# Patient Record
Sex: Female | Born: 1995 | Race: Black or African American | Hispanic: No | Marital: Single | Smoking: Never smoker
Health system: Southern US, Community
[De-identification: ages and names within clinical notes are randomized; demographics above are authoritative.]

---
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
## 2015-03-18 ENCOUNTER — Telehealth: Payer: Self-pay | Admitting: Family

## 2015-03-18 ENCOUNTER — Ambulatory Visit (INDEPENDENT_AMBULATORY_CARE_PROVIDER_SITE_OTHER): Payer: BLUE CROSS/BLUE SHIELD | Admitting: Family

## 2015-03-18 ENCOUNTER — Encounter: Payer: Self-pay | Admitting: Family

## 2015-03-18 ENCOUNTER — Other Ambulatory Visit: Payer: Self-pay | Admitting: Family

## 2015-03-18 ENCOUNTER — Ambulatory Visit (INDEPENDENT_AMBULATORY_CARE_PROVIDER_SITE_OTHER)
Admission: RE | Admit: 2015-03-18 | Discharge: 2015-03-18 | Disposition: A | Discharge status: Home / Self Care | Payer: BLUE CROSS/BLUE SHIELD | Source: Ambulatory Visit | Attending: Family | Admitting: Family

## 2015-03-18 VITALS — BP 102/60 | HR 98 | Temp 98.2°F | Resp 16 | Ht 63.0 in | Wt 117.0 lb

## 2015-03-18 DIAGNOSIS — M25559 Pain in unspecified hip: Secondary | ICD-10-CM | POA: Insufficient documentation

## 2015-03-18 DIAGNOSIS — N946 Dysmenorrhea, unspecified: Secondary | ICD-10-CM | POA: Diagnosis not present

## 2015-03-18 NOTE — Progress Notes (Signed)
Pre visit review using our clinic review tool, if applicable. No additional management support is needed unless otherwise documented below in the visit note. 

## 2015-03-18 NOTE — Assessment & Plan Note (Signed)
Symptoms and exam consistent with dysmenorrhea. Recommend starting over-the-counter medications as needed for symptom relief and supportive care. Refer to gynecology for further management. Follow-up if symptoms worsen or fail to improve prior to referral.

## 2015-03-18 NOTE — Progress Notes (Addendum)
Subjective:    Patient ID: Mikayla Robinson, female    DOB: 06/29/1995, 21 y.o.   MRN: [id number]  Chief Complaint  Patient presents with  . Establish Care    when sleeping she wakes up with pains in her upper thigh area, does not occur every night but when it does happen its really bad pain, would like a referral to GYN     HPI:  Mikayla Robinson is a 20 y.o. female who  has no past medical history on file. and presents today for an office visit to establish care.    1.) Dysmenorrhia - Associated symptom of dysmennorihia. She started at age 2. Period length is about 5 days and regular. Denies any modifying factors that make it better. Not currently on birth control.   2.) Groin pain - Associated symptom of pain located in her bilateral groins has been going on for about 1 year and described as an aching that comes and goes. Denies trauma or specific events that would have caused. Denies sounds/sensations heard or felt. Denies any modifying factors or treatments in attempted treatments. Aggravating factors include movement on occasion. Timing of the symptoms is worse at night and when she wakes up in the morning.   No Known Allergies   No current outpatient prescriptions on file prior to visit.   No current facility-administered medications on file prior to visit.    History reviewed. No pertinent past surgical history.   Review of Systems  Constitutional: Negative for fever and chills.  Genitourinary: Positive for menstrual problem. Negative for dysuria, urgency, frequency and hematuria.  Musculoskeletal:       Positive for bilateral hip pain.      Objective:    BP 102/60 mmHg  Pulse 98  Temp(Src) 98.2 F (36.8 C) (Oral)  Resp 16  Ht  (1.6 m)  Wt 117 lb (53.071 kg)  BMI 20.73 kg/m2  SpO2 98%  LMP 02/19/2015 Nursing note and vital signs reviewed.  Physical Exam  Constitutional: She is oriented to person, place, and time. She appears well-developed and  well-nourished. No distress.  Cardiovascular: Normal rate, regular rhythm, normal heart sounds and intact distal pulses.   Pulmonary/Chest: Effort normal and breath sounds normal.  Musculoskeletal:  Bilateral hips - no obvious deformity, discoloration, or edema noted. Mild tenderness elicited over anterior aspect a hip. Range of motion is within normal limits. Manual muscle testing reveals discomfort with hip flexion and internal rotation. Hip scouring is negative. Distal pulses and sensation are intact and appropriate.  Neurological: She is alert and oriented to person, place, and time.  Skin: Skin is warm and dry.  Psychiatric: She has a normal mood and affect. Her behavior is normal. Judgment and thought content normal.       Assessment & Plan:   Problem List Items Addressed This Visit      Genitourinary   Dysmenorrhea    Symptoms and exam consistent with dysmenorrhea. Recommend starting over-the-counter medications as needed for symptom relief and supportive care. Refer to gynecology for further management. Follow-up if symptoms worsen or fail to improve prior to referral.        Other   Hip pain - Primary    Hip pain most likely related to muscle skeletal origin with concern for underlying bony pathology. Obtain bilateral pelvis and hip x-rays to rule out avascular necrosis. Treat conservatively with over-the-counter medications as needed for symptom relief and supportive care. Start home exercise therapy. Follow-up if symptoms worsen or  fail to improve prior to x-ray results.      Relevant Orders   DG HIPS BILAT WITH PELVIS 2V (Completed)

## 2015-03-18 NOTE — Patient Instructions (Signed)
Thank you for choosing [facility].  Summary/Instructions:  Ibuprofen as needed for discomfort.  Stretching daily. Stop downstairs for x-rays.  Referrals have been made during this visit. You should expect to hear back from our schedulers in about 7-10 days in regards to establishing an appointment with the specialists we discussed.   If your symptoms worsen or fail to improve, please contact our office for further instruction, or in case of emergency go directly to the emergency room at the closest medical facility.   Dysmenorrhea Menstrual cramps (dysmenorrhea) are caused by the muscles of the uterus tightening (contracting) during a menstrual period. For some women, this discomfort is merely bothersome. For others, dysmenorrhea can be severe enough to interfere with everyday activities for a few days each month. Primary dysmenorrhea is menstrual cramps that last a couple of days when you start having menstrual periods or soon after. This often begins after a teenager starts having her period. As a woman gets older or has a baby, the cramps will usually lessen or disappear. Secondary dysmenorrhea begins later in life, lasts longer, and the pain may be stronger than primary dysmenorrhea. The pain may start before the period and last a few days after the period.  CAUSES  Dysmenorrhea is usually caused by an underlying problem, such as:  The tissue lining the uterus grows outside of the uterus in other areas of the body (endometriosis).  The endometrial tissue, which normally lines the uterus, is found in or grows into the muscular walls of the uterus (adenomyosis).  The pelvic blood vessels are engorged with blood just before the menstrual period (pelvic congestive syndrome).  Overgrowth of cells (polyps) in the lining of the uterus or cervix.  Falling down of the uterus (prolapse) because of loose or stretched ligaments.  Depression.  Bladder problems, infection, or  inflammation.  Problems with the intestine, a tumor, or irritable bowel syndrome.  Cancer of the female organs or bladder.  A severely tipped uterus.  A very tight opening or closed cervix.  Noncancerous tumors of the uterus (fibroids).  Pelvic inflammatory disease (PID).  Pelvic scarring (adhesions) from a previous surgery.  Ovarian cyst.  An intrauterine device (IUD) used for birth control. RISK FACTORS You may be at greater risk of dysmenorrhea if:  You are younger than age 42.  You started puberty early.  You have irregular or heavy bleeding.  You have never given birth.  You have a family history of this problem.  You are a smoker. SIGNS AND SYMPTOMS   Cramping or throbbing pain in your lower abdomen.  Headaches.  Lower back pain.  Nausea or vomiting.  Diarrhea.  Sweating or dizziness.  Loose stools. DIAGNOSIS  A diagnosis is based on your history, symptoms, physical exam, diagnostic tests, or procedures. Diagnostic tests or procedures may include:  Blood tests.  Ultrasonography.  An examination of the lining of the uterus (dilation and curettage, D&C).  An examination inside your abdomen or pelvis with a scope (laparoscopy).  X-rays.  CT scan.  MRI.  An examination inside the bladder with a scope (cystoscopy).  An examination inside the intestine or stomach with a scope (colonoscopy, gastroscopy). TREATMENT  Treatment depends on the cause of the dysmenorrhea. Treatment may include:  Pain medicine prescribed by your health care provider.  Birth control pills or an IUD with progesterone hormone in it.  Hormone replacement therapy.  Nonsteroidal anti-inflammatory drugs (NSAIDs). These may help stop the production of prostaglandins.  Surgery to remove adhesions, endometriosis,  ovarian cyst, or fibroids.  Removal of the uterus (hysterectomy).  Progesterone shots to stop the menstrual period.  Cutting the nerves on the sacrum that  go to the female organs (presacral neurectomy).  Electric current to the sacral nerves (sacral nerve stimulation).  Antidepressant medicine.  Psychiatric therapy, counseling, or group therapy.  Exercise and physical therapy.  Meditation and yoga therapy.  Acupuncture. HOME CARE INSTRUCTIONS   Only take over-the-counter or prescription medicines as directed by your health care provider.  Place a heating pad or hot water bottle on your lower back or abdomen. Do not sleep with the heating pad.  Use aerobic exercises, walking, swimming, biking, and other exercises to help lessen the cramping.  Massage to the lower back or abdomen may help.  Stop smoking.  Avoid alcohol and caffeine. SEEK MEDICAL CARE IF:   Your pain does not get better with medicine.  You have pain with sexual intercourse.  Your pain increases and is not controlled with medicines.  You have abnormal vaginal bleeding with your period.  You develop nausea or vomiting with your period that is not controlled with medicine. SEEK IMMEDIATE MEDICAL CARE IF:  You pass out.    This information is not intended to replace advice given to you by your health care provider. Make sure you discuss any questions you have with your health care provider.   Document Released: 01/03/2005 Document Revised: 09/05/2012 Document Reviewed: 06/21/2012 Elsevier Interactive Patient Education 2016 [employer]  Generic Hip Exercises RANGE OF MOTION (ROM) AND STRETCHING EXERCISES  These exercises may help you when beginning to rehabilitate your injury. Doing them too aggressively can worsen your condition. Complete them slowly and gently. Your symptoms may resolve with or without further involvement from your physician, physical therapist or athletic trainer. While completing these exercises, remember:   Restoring tissue flexibility helps normal motion to return to the joints. This allows healthier, less painful movement and  activity.  An effective stretch should be held for at least 30 seconds.  A stretch should never be painful. You should only feel a gentle lengthening or release in the stretched tissue. If these stretches worsen your symptoms even when done gently, consult your physician, physical therapist or athletic trainer. STRETCH - Hamstrings, Supine   Lie on your back. Loop a belt or towel over the ball of your right / left foot.  Straighten your right / left knee and slowly pull on the belt to raise your leg. Do not allow the right / left knee to bend. Keep your opposite leg flat on the floor.  Raise the leg until you feel a gentle stretch behind your right / left knee or thigh. Hold this position for __________ seconds. Repeat __________ times. Complete this stretch __________ times per day.  STRETCH - Hip Rotators   Lie on your back on a firm surface. Grasp your right / left knee with your right / left hand and your ankle with your opposite hand.  Keeping your hips and shoulders firmly planted, gently pull your right / left knee and rotate your lower leg toward your opposite shoulder until you feel a stretch in your buttocks.  Hold this stretch for __________ seconds. Repeat this stretch __________ times. Complete this stretch __________ times per day. STRETCH - Hamstrings/Adductors, V-Sit   Sit on the floor with your legs extended in a large "V," keeping your knees straight.  With your head and chest upright, bend at your waist reaching for your right foot to  stretch your left adductors.  You should feel a stretch in your left inner thigh. Hold for __________ seconds.  Return to the upright position to relax your leg muscles.  Continuing to keep your chest upright, bend straight forward at your waist to stretch your hamstrings.  You should feel a stretch behind both of your thighs and/or knees. Hold for __________ seconds.  Return to the upright position to relax your leg  muscles.  Repeat steps 2 through 4 for opposite leg. Repeat __________ times. Complete this exercise __________ times per day.  STRETCHING - Hip Flexors, Lunge  Half kneel with your right / left knee on the floor and your opposite knee bent and directly over your ankle.  Keep good posture with your head over your shoulders. Tighten your buttocks to point your tailbone downward; this will prevent your back from arching too much.  You should feel a gentle stretch in the front of your thigh and/or hip. If you do not feel any resistance, slightly slide your opposite foot forward and then slowly lunge forward so your knee once again lines up over your ankle. Be sure your tailbone remains pointed downward.  Hold this stretch for __________ seconds. Repeat __________ times. Complete this stretch __________ times per day. STRENGTHENING EXERCISES These exercises may help you when beginning to rehabilitate your injury. They may resolve your symptoms with or without further involvement from your physician, physical therapist or athletic trainer. While completing these exercises, remember:   Muscles can gain both the endurance and the strength needed for everyday activities through controlled exercises.  Complete these exercises as instructed by your physician, physical therapist or athletic trainer. Progress the resistance and repetitions only as guided.  You may experience muscle soreness or fatigue, but the pain or discomfort you are trying to eliminate should never worsen during these exercises. If this pain does worsen, stop and make certain you are following the directions exactly. If the pain is still present after adjustments, discontinue the exercise until you can discuss the trouble with your clinician. STRENGTH - Hip Extensors, Bridge   Lie on your back on a firm surface. Bend your knees and place your feet flat on the floor.  Tighten your buttocks muscles and lift your bottom off the floor  until your trunk is level with your thighs. You should feel the muscles in your buttocks and back of your thighs working. If you do not feel these muscles, slide your feet 1-2 inches further away from your buttocks.  Hold this position for __________ seconds.  Slowly lower your hips to the starting position and allow your buttock muscles relax completely before beginning the next repetition.  If this exercise is too easy, you may cross your arms over your chest. Repeat __________ times. Complete this exercise __________ times per day.  STRENGTH - Hip Abductors, Straight Leg Raises  Be aware of your form throughout the entire exercise so that you exercise the correct muscles. Sloppy form means that you are not strengthening the correct muscles.  Lie on your side so that your head, shoulders, knee and hip line up. You may bend your lower knee to help maintain your balance. Your right / left leg should be on top.  Roll your hips slightly forward, so that your hips are stacked directly over each other and your right / left knee is facing forward.  Lift your top leg up 4-6 inches, leading with your heel. Be sure that your foot does not drift forward  or that your knee does not roll toward the ceiling.  Hold this position for __________ seconds. You should feel the muscles in your outer hip lifting (you may not notice this until your leg begins to tire).  Slowly lower your leg to the starting position. Allow the muscles to fully relax before beginning the next repetition. Repeat __________ times. Complete this exercise __________ times per day.  STRENGTH - Hip Adductors, Straight Leg Raises   Lie on your side so that your head, shoulders, knee and hip line up. You may place your upper foot in front to help maintain your balance. Your right / left leg should be on the bottom.  Roll your hips slightly forward, so that your hips are stacked directly over each other and your right / left knee is facing  forward.  Tense the muscles in your inner thigh and lift your bottom leg 4-6 inches. Hold this position for __________ seconds.  Slowly lower your leg to the starting position. Allow the muscles to fully relax before beginning the next repetition. Repeat __________ times. Complete this exercise __________ times per day.  STRENGTH - Quadriceps, Straight Leg Raises  Quality counts! Watch for signs that the quadriceps muscle is working to insure you are strengthening the correct muscles and not "cheating" by substituting with healthier muscles.  Lay on your back with your right / left leg extended and your opposite knee bent.  Tense the muscles in the front of your right / left thigh. You should see either your knee cap slide up or increased dimpling just above the knee. Your thigh may even quiver.  Tighten these muscles even more and raise your leg 4 to 6 inches off the floor. Hold for right / left seconds.  Keeping these muscles tense, lower your leg.  Relax the muscles slowly and completely in between each repetition. Repeat __________ times. Complete this exercise __________ times per day.  STRENGTH - Hip Abductors, Standing  Tie one end of a rubber exercise band/tubing to a secure surface (table, pole) and tie a loop at the other end.  Place the loop around your right / left ankle. Keeping your ankle with the band directly opposite of the secured end, step away until there is tension in the tube/band.  Hold onto a chair as needed for balance.  Keeping your back upright, your shoulders over your hips, and your toes pointing forward, lift your right / left leg out to your side. Be sure to lift your leg with your hip muscles. Do not "throw" your leg or tip your body to lift your leg.  Slowly and with control, return to the starting position. Repeat exercise __________ times. Complete this exercise __________ times per day.  STRENGTH - Quadriceps, Squats  Stand in a door frame so that  your feet and knees are in line with the frame.  Use your hands for balance, not support, on the frame.  Slowly lower your weight, bending at the hips and knees. Keep your lower legs upright so that they are parallel with the door frame. Squat only within the range that does not increase your knee pain. Never let your hips drop below your knees.  Slowly return upright, pushing with your legs, not pulling with your hands.   This information is not intended to replace advice given to you by your health care provider. Make sure you discuss any questions you have with your health care provider.   Document Released: 01/21/2005 Document Revised: 01/24/2014  Document Reviewed: 04/17/2008 Elsevier Interactive Patient Education [employer]

## 2015-03-18 NOTE — Telephone Encounter (Signed)
Please inform patient that her x-rays are normal and to continue with treatment as we discussed.

## 2015-03-18 NOTE — Assessment & Plan Note (Signed)
Hip pain most likely related to muscle skeletal origin with concern for underlying bony pathology. Obtain bilateral pelvis and hip x-rays to rule out avascular necrosis. Treat conservatively with over-the-counter medications as needed for symptom relief and supportive care. Start home exercise therapy. Follow-up if symptoms worsen or fail to improve prior to x-ray results.

## 2015-03-19 ENCOUNTER — Other Ambulatory Visit: Payer: Self-pay | Admitting: Family

## 2015-03-19 DIAGNOSIS — N946 Dysmenorrhea, unspecified: Secondary | ICD-10-CM

## 2015-03-19 NOTE — Telephone Encounter (Signed)
Referral placed.

## 2015-03-19 NOTE — Telephone Encounter (Signed)
Pt informed of results.   Reviewed OV notes. Referral for GYN noted - does this need to be entered?

## 2015-03-23 ENCOUNTER — Telehealth: Payer: Self-pay | Admitting: Obstetrics and Gynecology

## 2015-03-23 NOTE — Telephone Encounter (Signed)
Called and left a message for patient to call back to schedule a new patient doctor referral. °

## 2015-04-08 ENCOUNTER — Ambulatory Visit (INDEPENDENT_AMBULATORY_CARE_PROVIDER_SITE_OTHER): Payer: BLUE CROSS/BLUE SHIELD | Admitting: Obstetrics and Gynecology

## 2015-04-08 ENCOUNTER — Encounter: Payer: Self-pay | Admitting: Obstetrics and Gynecology

## 2015-04-08 VITALS — BP 118/60 | HR 72 | Resp 15 | Ht 62.5 in | Wt 115.0 lb

## 2015-04-08 DIAGNOSIS — N946 Dysmenorrhea, unspecified: Secondary | ICD-10-CM

## 2015-04-08 DIAGNOSIS — Z01419 Encounter for gynecological examination (general) (routine) without abnormal findings: Secondary | ICD-10-CM

## 2015-04-08 DIAGNOSIS — N898 Other specified noninflammatory disorders of vagina: Secondary | ICD-10-CM | POA: Diagnosis not present

## 2015-04-08 MED ORDER — NORETHIN ACE-ETH ESTRAD-FE 1-20 MG-MCG PO TABS: 1.0000 | ORAL_TABLET | Freq: Every day | ORAL | Status: DC

## 2015-04-08 NOTE — Progress Notes (Signed)
Patient ID: Mikayla Robinson, female   DOB: 1995-09-23, 20 y.o.   MRN: [id number] 20 y.o. [id number] Single African AmericanF here for annual exam. Patient c/o vaginal discharge for years. White, creamy vaginal d/c. No vulvar irritation. Doesn't wear a mini-pad. Period Cycle (Days): 28 Period Duration (Days): 5 days  Period Pattern: Regular Menstrual Flow: Moderate Menstrual Control: Thin pad, Maxi pad Dysmenorrhea: (!) Moderate Dysmenorrhea Symptoms: Cramping  Saturates a pill in 4 + hours. Cramps can be bad, can't swallow pills, not missing working, but hard to function. Never sexually active, no boyfriend, plans to wait for marriage to be sexually active.    Patient's last menstrual period was 03/21/2015.          Sexually active: No. (Never) The current method of family planning is none.    Exercising: No.  The patient does not participate in regular exercise at present. Smoker:  no  Health Maintenance: Pap:  Never History of abnormal Pap:  N/A TDaP:  unsure Gardasil: completed all 3    reports that she has never smoked. She has never used smokeless tobacco. 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   History reviewed. No pertinent past medical history.  History reviewed. No pertinent past surgical history.  No current outpatient prescriptions on file.   No current facility-administered medications for this visit.    Family History  Problem Relation Age of Onset  . Healthy Mother   . Healthy Father   . Hypertension Maternal Grandmother   . Hyperlipidemia Maternal Grandmother   . Diabetes Maternal Grandfather   . Hypertension Paternal Grandmother   . Kidney disease Paternal Grandfather     Review of Systems  Constitutional: Negative.   HENT: Negative.   Eyes: Negative.   Respiratory: Negative.   Cardiovascular: Negative.    Gastrointestinal: Negative.   Endocrine: Negative.   Genitourinary: Positive for vaginal discharge.  Musculoskeletal: Negative.   Skin: Negative.   Allergic/Immunologic: Negative.   Neurological: Negative.   Psychiatric/Behavioral: Negative.     Exam:   BP 118/60 mmHg  Pulse 72  Resp 15  Ht 5' 2.5" (1.588 m)  Wt 115 lb (52.164 kg)  BMI 20.69 kg/m2  LMP 03/21/2015  Weight change: @ Height:   Height: 5' 2.5" (158.8 cm)  Ht Readings from Last 3 Encounters:  04/08/15 5' 2.5" (1.588 m) (24 %*, Z = -0.70)  03/18/15  (1.6 m) (31 %*, Z = -0.51)   * Growth percentiles are based on CDC 2-20 Years data.    General appearance: alert, cooperative and appears stated age Head: Normocephalic, without obvious abnormality, atraumatic Neck: no adenopathy, supple, symmetrical, trachea midline and thyroid normal to inspection and palpation Lungs: clear to auscultation bilaterally Breasts: normal appearance, no masses or tenderness Heart: regular rate and rhythm Abdomen: soft, non-tender; bowel sounds normal; no masses,  no organomegaly Extremities: extremities normal, atraumatic, no cyanosis or edema Skin: Skin color, texture, turgor normal. No rashes or lesions Lymph nodes: Cervical, supraclavicular, and axillary nodes normal. No abnormal inguinal nodes palpated Neurologic: Grossly normal   Pelvic: External genitalia:  no lesions              Urethra:  normal appearing urethra with no masses, tenderness or lesions              Bartholins and Skenes: normal  Vagina: normal appearing vagina with normal color and discharge, no lesions              Cervix: not seen, too uncomfortable to fully open the pediatric speculum               Bimanual Exam:  Not done, too uncomfortable with attempted insertion of one vaginal finger                Chaperone was present for exam.  Wet prep: no clue, no trich, + wbc KOH: no yeast PH: 4   A:  Well Woman with normal  exam  Vaginal discharge, likely physiologic, negative vaginal slides  Dysmenorrhea  Never sexually active  P:   No pap until 21  No STD testing needed  Send wet prep probe  Start OCP's for dysmenorrhea, no contraindications, risks reviewed  F/U in 3 months  She can't swallow pills, can take liquid advil  Discussed calcium and vit D

## 2015-04-08 NOTE — Patient Instructions (Addendum)
EXERCISE AND DIET:  We recommended that you start or continue a regular exercise program for good health. Regular exercise means any activity that makes your heart beat faster and makes you sweat.  We recommend exercising at least 30 minutes per day at least 3 days a week, preferably 4 or 5.  We also recommend a diet low in fat and sugar.  Inactivity, poor dietary choices and obesity can cause diabetes, heart attack, stroke, and kidney damage, among others.    ALCOHOL AND SMOKING:  Women should limit their alcohol intake to no more than 7 drinks/beers/glasses of wine (combined, not each!) per week. Moderation of alcohol intake to this level decreases your risk of breast cancer and liver damage. And of course, no recreational drugs are part of a healthy lifestyle.  And absolutely no smoking or even second hand smoke. Most people know smoking can cause heart and lung diseases, but did you know it also contributes to weakening of your bones? Aging of your skin?  Yellowing of your teeth and nails?  CALCIUM AND VITAMIN D:  Adequate intake of calcium and Vitamin D are recommended.  The recommendations for exact amounts of these supplements seem to change often, but generally speaking 600 mg of calcium (either carbonate or citrate) and 800 units of Vitamin D per day seems prudent. Certain women may benefit from higher intake of Vitamin D.  If you are among these women, your doctor will have told you during your visit.    PAP SMEARS:  Pap smears, to check for cervical cancer or precancers,  have traditionally been done yearly, although recent scientific advances have shown that most women can have pap smears less often.  However, every woman still should have a physical exam from her gynecologist every year. It will include a breast check, inspection of the vulva and vagina to check for abnormal growths or skin changes, a visual exam of the cervix, and then an exam to evaluate the size and shape of the uterus and  ovaries.  And after 20 years of age, a rectal exam is indicated to check for rectal cancers. We will also provide age appropriate advice regarding health maintenance, like when you should have certain vaccines, screening for sexually transmitted diseases, bone density testing, colonoscopy, mammograms, etc.    You can take liquid advil (over the counter), 400 mg every 4 hours as needed.  Oral Contraception Information Oral contraceptive pills (OCPs) are medicines taken to prevent pregnancy. OCPs work by preventing the ovaries from releasing eggs. The hormones in OCPs also cause the cervical mucus to thicken, preventing the sperm from entering the uterus. The hormones also cause the uterine lining to become thin, not allowing a fertilized egg to attach to the inside of the uterus. OCPs are highly effective when taken exactly as prescribed. However, OCPs do not prevent sexually transmitted diseases (STDs). Safe sex practices, such as using condoms along with the pill, can help prevent STDs.  Before taking the pill, you may have a physical exam and Pap test. Your health care provider may order blood tests. The health care provider will make sure you are a good candidate for oral contraception. Discuss with your health care provider the possible side effects of the OCP you may be prescribed. When starting an OCP, it can take 2 to 3 months for the body to adjust to the changes in hormone levels in your body.  TYPES OF ORAL CONTRACEPTION  The combination pill--This pill contains estrogen and progestin (  synthetic progesterone) hormones. The combination pill comes in 21-day, 28-day, or 91-day packs. Some types of combination pills are meant to be taken continuously (365-day pills). With 21-day packs, you do not take pills for 7 days after the last pill. With 28-day packs, the pill is taken every day. The last 7 pills are without hormones. Certain types of pills have more than 21 hormone-containing pills. With  91-day packs, the first 84 pills contain both hormones, and the last 7 pills contain no hormones or contain estrogen only.  The minipill--This pill contains the progesterone hormone only. The pill is taken every day continuously. It is very important to take the pill at the same time each day. The minipill comes in packs of 28 pills. All 28 pills contain the hormone.  ADVANTAGES OF ORAL CONTRACEPTIVE PILLS  Decreases premenstrual symptoms.   Treats menstrual period cramps.   Regulates the menstrual cycle.   Decreases a heavy menstrual flow.   May treatacne, depending on the type of pill.   Treats abnormal uterine bleeding.   Treats polycystic ovarian syndrome.   Treats endometriosis.   Can be used as emergency contraception.  THINGS THAT CAN MAKE ORAL CONTRACEPTIVE PILLS LESS EFFECTIVE OCPs can be less effective if:   You forget to take the pill at the same time every day.   You have a stomach or intestinal disease that lessens the absorption of the pill.   You take OCPs with other medicines that make OCPs less effective, such as antibiotics, certain HIV medicines, and some seizure medicines.   You take expired OCPs.   You forget to restart the pill on day 7, when using the packs of 21 pills.  RISKS ASSOCIATED WITH ORAL CONTRACEPTIVE PILLS  Oral contraceptive pills can sometimes cause side effects, such as:  Headache.  Nausea.  Breast tenderness.  Irregular bleeding or spotting. Combination pills are also associated with a small increased risk of:  Blood clots.  Heart attack.  Stroke.   This information is not intended to replace advice given to you by your health care provider. Make sure you discuss any questions you have with your health care provider.   Document Released: 03/26/2002 Document Revised: 10/24/2012 Document Reviewed: 06/24/2012 Elsevier Interactive Patient Education [employer]

## 2015-04-09 ENCOUNTER — Telehealth: Payer: Self-pay | Admitting: *Deleted

## 2015-04-09 LAB — WET PREP BY MOLECULAR PROBE
CANDIDA SPECIES: POSITIVE — AB
GARDNERELLA VAGINALIS: POSITIVE — AB
TRICHOMONAS VAG: NEGATIVE

## 2015-04-09 NOTE — Telephone Encounter (Signed)
LMTC in regards to lab results -eh 

## 2015-04-09 NOTE — Telephone Encounter (Signed)
-----   Message from Romualdo BolkJill Evelyn Jertson, MD sent at 04/09/2015 12:05 PM EDT ----- Please let the patient know that her vaginitis probe returned with BV and yeast. In general BV is associated with sexual activity, doesn't necessarily have to be penetration. Can also occur with sexual activity between 2 women. Because she has BV, I think the safest thing is to have her come in and give us a non clean catch urine to send for GC/CT.  Please call in flagyl 500 mg BID x 7 days (no alcohol with flagyl), #14, no refills Please also call in Diflucan 150 mg po x 1 now, repeat x 1 in 72 hours if symptoms persist, #2, no refills.

## 2015-04-09 NOTE — Telephone Encounter (Signed)
Return call to Elaine. °

## 2015-04-13 MED ORDER — FLUCONAZOLE 150 MG PO TABS: 150.0000 mg | ORAL_TABLET | Freq: Once | ORAL | Status: DC

## 2015-04-13 MED ORDER — METRONIDAZOLE 500 MG PO TABS: 500.0000 mg | ORAL_TABLET | Freq: Two times a day (BID) | ORAL | Status: DC

## 2015-04-13 NOTE — Telephone Encounter (Signed)
Called patient back and went over results in detail. Patients denies ever having any kind of sexual actively ever. I informed her I would be calling in 2 RX to treat her with. She is coming back in on Wednesday to [facility] a [facility]

## 2015-04-15 ENCOUNTER — Other Ambulatory Visit: Payer: Self-pay

## 2015-04-15 ENCOUNTER — Other Ambulatory Visit (INDEPENDENT_AMBULATORY_CARE_PROVIDER_SITE_OTHER): Payer: BLUE CROSS/BLUE SHIELD

## 2015-04-15 ENCOUNTER — Ambulatory Visit: Payer: BLUE CROSS/BLUE SHIELD

## 2015-04-15 ENCOUNTER — Other Ambulatory Visit: Payer: Self-pay | Admitting: *Deleted

## 2015-04-15 DIAGNOSIS — Z113 Encounter for screening for infections with a predominantly sexual mode of transmission: Secondary | ICD-10-CM

## 2015-04-16 LAB — GC/CHLAMYDIA PROBE AMP
CT Probe RNA: NOT DETECTED
GC Probe RNA: NOT DETECTED

## 2015-04-17 ENCOUNTER — Telehealth: Payer: Self-pay | Admitting: *Deleted

## 2015-04-17 NOTE — Telephone Encounter (Signed)
Patient notified of results. See result note.  

## 2015-04-17 NOTE — Telephone Encounter (Signed)
-----   Message from Romualdo BolkJill Evelyn Jertson, MD sent at 04/16/2015  4:59 PM EDT ----- Please advise the patient of normal results.

## 2015-04-17 NOTE — Telephone Encounter (Signed)
LMTC for lab results -eh  

## 2015-04-29 ENCOUNTER — Ambulatory Visit (INDEPENDENT_AMBULATORY_CARE_PROVIDER_SITE_OTHER): Payer: BLUE CROSS/BLUE SHIELD | Admitting: Obstetrics and Gynecology

## 2015-04-29 ENCOUNTER — Encounter: Payer: Self-pay | Admitting: Obstetrics and Gynecology

## 2015-04-29 VITALS — BP 100/60 | HR 82 | Resp 14 | Ht 62.5 in | Wt 117.4 lb

## 2015-04-29 DIAGNOSIS — N898 Other specified noninflammatory disorders of vagina: Secondary | ICD-10-CM

## 2015-04-29 DIAGNOSIS — N946 Dysmenorrhea, unspecified: Secondary | ICD-10-CM

## 2015-04-29 NOTE — Patient Instructions (Signed)
Take 400 mg of liquid advil every 4 hours as needed.

## 2015-04-29 NOTE — Progress Notes (Signed)
GYNECOLOGY  VISIT   HPI: 20 y.o.   Single  African American  female   G0P0000 with Patient's last menstrual period was 04/22/2015.   here for  Follow up bacterial vaginosis treatment and new start birth control pills. The patient was treated for BV and yeast last month. Her d/c has been there for years, now it's gone. She has never been sexually active, denies a h/o abuse. No h/o sexually activity of any kind with men or women. She denies sharing towels with anyone.  She was negative for GC/CT. She just started ocp's, hasn't had a cycle yet. GYNECOLOGIC HISTORY: Patient's last menstrual period was 04/22/2015. Contraception: Junel FE 1/20 Menopausal hormone therapy: NA        OB History    Gravida Para Term Preterm AB TAB SAB Ectopic Multiple Living   0 0 0 0 0 0 0 0 0 0          Patient Active Problem List   Diagnosis Date Noted  . Hip pain 03/18/2015  . Dysmenorrhea 03/18/2015    No past medical history on file.  No past surgical history on file.  Current Outpatient Prescriptions  Medication Sig Dispense Refill  . norethindrone-ethinyl estradiol (JUNEL FE,GILDESS FE,LOESTRIN FE) 1-20 MG-MCG tablet Take 1 tablet by mouth daily. 3 Package 0   No current facility-administered medications for this visit.     ALLERGIES: Review of patient's allergies indicates no known allergies.  Family History  Problem Relation Age of Onset  . Healthy Mother   . Healthy Father   . Hypertension Maternal Grandmother   . Hyperlipidemia Maternal Grandmother   . Diabetes Maternal Grandfather   . Hypertension Paternal Grandmother   . Kidney disease Paternal Grandfather     Social History   Social History  . Marital Status: Single    Spouse Name: N/A  . Number of Children: 0  . 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 Smoking status: Never Smoker   . Smokeless tobacco: Never Used  . Alcohol Use: No  . Drug Use: No  .  Sexual Activity: No   Other Topics Concern  . Not on file   Social History Narrative   Fun: Write   Denies abuse and feels safe at home.     Review of Systems  Constitutional: Negative.   HENT: Negative.   Eyes: Negative.   Respiratory: Negative.   Cardiovascular: Negative.   Gastrointestinal: Negative.   Genitourinary: Negative.   Musculoskeletal: Negative.   Skin: Negative.   Neurological: Negative.   Endo/Heme/Allergies: Negative.   Psychiatric/Behavioral: Negative.     PHYSICAL EXAMINATION:    BP 100/60 mmHg  Pulse 82  Resp 14  Ht 5' 2.5" (1.588 m)  Wt 117 lb 6.4 oz (53.252 kg)  BMI 21.12 kg/m2  LMP 04/22/2015    General appearance: alert, cooperative and appears stated age  ASSESSMENT Dysmenorrhea, just started on OCP's Long h/o vaginal d/c, resolved after treatment of BV and yeast. No h/o sexual activity, denies a h/o abuse    PLAN Return in 6/17 for a pill check Discussed using liquid advil (she hasn't tried it), has trouble with pills Return with any abnormal vaginal d/c   An After Visit Summary was printed and given to the patient.  10 minutes face to face time of which over 50% was spent in counseling.

## 2015-07-08 ENCOUNTER — Encounter: Payer: Self-pay | Admitting: Obstetrics and Gynecology

## 2015-07-08 ENCOUNTER — Ambulatory Visit (INDEPENDENT_AMBULATORY_CARE_PROVIDER_SITE_OTHER): Payer: BLUE CROSS/BLUE SHIELD | Admitting: Obstetrics and Gynecology

## 2015-07-08 VITALS — BP 108/60 | HR 76 | Resp 16 | Ht 62.5 in | Wt 117.4 lb

## 2015-07-08 DIAGNOSIS — N946 Dysmenorrhea, unspecified: Secondary | ICD-10-CM

## 2015-07-08 DIAGNOSIS — Z3041 Encounter for surveillance of contraceptive pills: Secondary | ICD-10-CM

## 2015-07-08 MED ORDER — NAPROXEN SODIUM 550 MG PO TABS: ORAL_TABLET | ORAL | Status: DC

## 2015-07-08 MED ORDER — NORETHIN ACE-ETH ESTRAD-FE 1-20 MG-MCG PO TABS: 1.0000 | ORAL_TABLET | Freq: Every day | ORAL | Status: DC

## 2015-07-08 NOTE — Progress Notes (Signed)
GYNECOLOGY  VISIT   HPI: 20 y.o.   Single  African American  female   G0P0000 with Patient's last menstrual period was 06/22/2015. Pt advised LMP was actually 06/22/15.  Pt here for 3 month f/u of OCP's. She was started on OCP's for dysmenorrhea, not sexually active. On the pill she is cycling monthly x 5 days. Saturates a pad in 6 hours. Cramps have improved but still bad for one day. Not taking anything. She is able to function, but just very uncomfortable. No side effects with the pill.   GYNECOLOGIC HISTORY: Patient's last menstrual period was 06/22/2015. Contraception:OCP's Menopausal hormone therapy: None        OB History    Gravida Para Term Preterm AB TAB SAB Ectopic Multiple Living   0 0 0 0 0 0 0 0 0 0          Patient Active Problem List   Diagnosis Date Noted  . Hip pain 03/18/2015  . Dysmenorrhea 03/18/2015    History reviewed. No pertinent past medical history.  History reviewed. No pertinent past surgical history.  Current Outpatient Prescriptions  Medication Sig Dispense Refill  . norethindrone-ethinyl estradiol (JUNEL FE,GILDESS FE,LOESTRIN FE) 1-20 MG-MCG tablet Take 1 tablet by mouth daily. 3 Package 0   No current facility-administered medications for this visit.     ALLERGIES: Review of patient's allergies indicates no known allergies.  Family History  Problem Relation Age of Onset  . Healthy Mother   . Healthy Father   . Hypertension Maternal Grandmother   . Hyperlipidemia Maternal Grandmother   . Diabetes Maternal Grandfather   . Hypertension Paternal Grandmother   . Kidney disease Paternal Grandfather     Social History   Social History  . Marital Status: Single    Spouse Name: N/A  . Number of Children: 0  . 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 Smoking status: Never Smoker   . Smokeless tobacco: Never Used  . Alcohol Use: No  . Drug Use: No  . Sexual Activity: No    Other Topics Concern  . Not on file   Social History Narrative   Fun: Write   Denies abuse and feels safe at home.     ROS  PHYSICAL EXAMINATION:    BP 108/60 mmHg  Pulse 76  Resp 16  Ht 5' 2.5" (1.588 m)  Wt 117 lb 6.4 oz (53.252 kg)  BMI 21.12 kg/m2  LMP 06/22/2015    General appearance: alert, cooperative and appears stated age   ASSESSMENT Dysmenorrhea, improved on OCP's, but still bothersome    PLAN Continue OCP's Start Anaprox for cramps F/U in 3/18 for an annual exam   An After Visit Summary was printed and given to the patient.

## 2015-08-06 ENCOUNTER — Telehealth: Payer: Self-pay | Admitting: Obstetrics and Gynecology

## 2015-08-06 NOTE — Telephone Encounter (Signed)
Patient having some "problems with her back" and would like to see Dr.Jertson.

## 2015-08-07 NOTE — Telephone Encounter (Signed)
Patient returned call. Patient off work and available to speak for remainder of afternoon. [phone]

## 2015-08-07 NOTE — Telephone Encounter (Signed)
Spoke with patient. Patient states that she has been experiencing lower back pain and vaginal discharge. States this has been going on for a while. Unable to tell me date this started. States that the vaginal discharge is increasing. Discharge is clear in color without odor. Is also having mild uterine cramping. Denies any urinary symptoms, fever, or chills. Patient is requesting to be seen with Dr.Jertson on Monday. Appointment scheduled for 08/10/2015 at 9 am with Dr.Jertson. She is agreeable to date and time. Advised she may take OTC Ibuprofen/Motrin 600 mg every 6 hours as needed for cramping and lower back discomfort. Advised if symptoms worsen or she develops new symptoms she will need to be seen over the weekend at a local urgent care or ER. She is agreeable and verbalizes understanding.  Routing to provider for final review. Patient agreeable to disposition. Will close encounter.

## 2015-08-07 NOTE — Telephone Encounter (Signed)
Message left to return call to Lindee Leason at [phone].    

## 2015-08-10 ENCOUNTER — Encounter: Payer: Self-pay | Admitting: Obstetrics and Gynecology

## 2015-08-10 ENCOUNTER — Ambulatory Visit (INDEPENDENT_AMBULATORY_CARE_PROVIDER_SITE_OTHER): Payer: BLUE CROSS/BLUE SHIELD | Admitting: Obstetrics and Gynecology

## 2015-08-10 VITALS — BP 112/60 | HR 80 | Wt 112.0 lb

## 2015-08-10 DIAGNOSIS — N898 Other specified noninflammatory disorders of vagina: Secondary | ICD-10-CM | POA: Diagnosis not present

## 2015-08-10 DIAGNOSIS — N9489 Other specified conditions associated with female genital organs and menstrual cycle: Secondary | ICD-10-CM | POA: Diagnosis not present

## 2015-08-10 NOTE — Progress Notes (Signed)
GYNECOLOGY  VISIT   HPI: 20 y.o.   Single  African American  female   G0P0000 with Patient's last menstrual period was 07/22/2015 (approximate).   here for vaginal discharge and slight odor X 1 month. The d/c is watery and clear. No itching, burning or irritation.  She was started on anaprox for her cramps at her last visit. The combination of the pill and the anaprox markedly help her cramps. The patient denies ever being sexually active. In 3/17 she was treated for BV and yeast  GYNECOLOGIC HISTORY: Patient's last menstrual period was 07/22/2015 (approximate). Contraception:OCP Menopausal hormone therapy: none        OB History    Gravida Para Term Preterm AB Living   0 0 0 0 0 0   SAB TAB Ectopic Multiple Live Births   0 0 0 0           Patient Active Problem List   Diagnosis Date Noted  . Hip pain 03/18/2015  . Dysmenorrhea 03/18/2015    No past medical history on file.  No past surgical history on file.  Current Outpatient Prescriptions  Medication Sig Dispense Refill  . naproxen sodium (ANAPROX DS) 550 MG tablet 1 tab po q 12 hours prn 30 tablet 2  . norethindrone-ethinyl estradiol (JUNEL FE,GILDESS FE,LOESTRIN FE) 1-20 MG-MCG tablet Take 1 tablet by mouth daily. 3 Package 2   No current facility-administered medications for this visit.      ALLERGIES: Review of patient's allergies indicates no known allergies.  Family History  Problem Relation Age of Onset  . Healthy Mother   . Healthy Father   . Hypertension Maternal Grandmother   . Hyperlipidemia Maternal Grandmother   . Diabetes Maternal Grandfather   . Hypertension Paternal Grandmother   . Kidney disease Paternal Grandfather     Social History   Social History  . Marital status: Single    Spouse name: N/A  . Number of children: 0  . 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 Smoking status: Never Smoker  . Smokeless tobacco: Never  Used  . Alcohol use No  . Drug use: No  . Sexual activity: No   Other Topics Concern  . Not on file   Social History Narrative   Fun: Write   Denies abuse and feels safe at home.     Review of Systems  Constitutional: Positive for chills.  HENT: Negative.   Eyes: Negative.   Respiratory: Negative.   Cardiovascular: Positive for chest pain.  Gastrointestinal: Negative.   Genitourinary:       Vaginal discharge with slight odor  Musculoskeletal: Negative.   Skin: Negative.   Endo/Heme/Allergies: Negative.   Psychiatric/Behavioral: Positive for depression.    PHYSICAL EXAMINATION:    BP 112/60 (BP Location: Right Arm, Patient Position: Sitting, Cuff Size: Normal)   Pulse 80   Wt 112 lb (50.8 kg)   LMP 07/22/2015 (Approximate)   BMI 20.16 kg/m     General appearance: alert, cooperative and appears stated age  Pelvic: External genitalia:  no lesions              Urethra:  normal appearing urethra with no masses, tenderness or lesions              Bartholins and Skenes: normal                 Vagina: normal appearing vagina with  normal color and discharge, no lesions              Cervix: not seen, patient uncomfortable with use of a pediatric speculum  Chaperone was present for exam.  ASSESSMENT Vaginal discharge Vaginal odor    PLAN Wet prep probe sent Discussed vulvar skin care   An After Visit Summary was printed and given to the patient.

## 2015-08-11 ENCOUNTER — Telehealth: Payer: Self-pay | Admitting: *Deleted

## 2015-08-11 LAB — WET PREP BY MOLECULAR PROBE
CANDIDA SPECIES: NEGATIVE
Gardnerella vaginalis: POSITIVE — AB
Trichomonas vaginosis: NEGATIVE

## 2015-08-11 NOTE — Telephone Encounter (Signed)
-----   Message from Romualdo Bolk, MD sent at 08/11/2015 10:42 AM EDT ----- Please inform the patient that her vaginitis probe was + for BV and treat with flagyl, no ETOH while on Flagyl.  Oral: Flagyl 500 mg BID x 7 days I'm not sure how she is getting BV, she has never been sexually active. It is possible to get BV with oral sex, or if she is using toys that haven't been cleaned.

## 2015-08-11 NOTE — Telephone Encounter (Signed)
Left message to call regarding results -eh 

## 2015-08-13 ENCOUNTER — Other Ambulatory Visit: Payer: Self-pay | Admitting: *Deleted

## 2015-08-13 MED ORDER — METRONIDAZOLE 500 MG PO TABS: 500.0000 mg | ORAL_TABLET | Freq: Two times a day (BID) | ORAL | 0 refills | Status: DC

## 2015-08-13 NOTE — Telephone Encounter (Signed)
RX for Flagyl sent in per Dr. Oscar La to treat BV -eh

## 2015-08-13 NOTE — Telephone Encounter (Signed)
Spoke with patient -see result note -eh 

## 2016-01-14 ENCOUNTER — Encounter: Payer: Self-pay | Admitting: Obstetrics and Gynecology

## 2016-03-18 ENCOUNTER — Telehealth: Payer: Self-pay | Admitting: Obstetrics and Gynecology

## 2016-03-18 NOTE — Telephone Encounter (Signed)
Spoke with patient. Patient states she has been experiencing nose bleeds possibly related to dry heat and air, can I be seen by Dr. Oscar LaJertson. Recommended patient to f/u with pcp/urgent care/ER for evaluation of nose bleeds. Patient states she is still experiencing cramping with menses, reports aleve helps some. Patient states she never "really started" prescribed OCP, reports not taking. Patient states she is still continuing to experience vaginal discharge, reports no color or odor. Patient sates Dr. Oscar LaJertson advised this may be normal. Recommended OV for further evaluation and discussion of OCP. Patient scheduled for 03/21/16 at 11:30am with Dr. Oscar LaJertson. Patient verbalizes understanding and is agreeable.  Routing to provider for final review. Patient is agreeable to disposition. Will close encounter.

## 2016-03-18 NOTE — Telephone Encounter (Signed)
Patient is asking to talk with Dr.Jertson about her cramps while in her cycle.

## 2016-03-18 NOTE — Telephone Encounter (Signed)
Left message to call Shontez Sermon at [phone].  

## 2016-03-21 ENCOUNTER — Encounter: Payer: Self-pay | Admitting: Obstetrics and Gynecology

## 2016-03-21 ENCOUNTER — Ambulatory Visit: Payer: BLUE CROSS/BLUE SHIELD | Admitting: Obstetrics and Gynecology

## 2016-03-21 VITALS — BP 92/60 | HR 64 | Resp 14 | Wt 117.0 lb

## 2016-03-21 DIAGNOSIS — N946 Dysmenorrhea, unspecified: Secondary | ICD-10-CM

## 2016-03-21 MED ORDER — NAPROXEN SODIUM 550 MG PO TABS: 550.0000 mg | ORAL_TABLET | Freq: Two times a day (BID) | ORAL | 2 refills | Status: DC

## 2016-03-21 MED ORDER — NORETHIN ACE-ETH ESTRAD-FE 1-20 MG-MCG PO TABS: 1.0000 | ORAL_TABLET | Freq: Every day | ORAL | 1 refills | Status: DC

## 2016-03-21 NOTE — Progress Notes (Signed)
GYNECOLOGY  VISIT   HPI: 21 y.o.   Single  African American  female   G0P0000 with Patient's last menstrual period was 03/15/2016.   here to discuss birth control options. She was given OCP at her last visit but never started them.  She has severe cramps. Menses q month x 5-6 days, saturates a pad in 4-5 hours. Cramps are bad for 1-2 days. Not missing school, but has had to leave work for it.  Never sexually active. No plans to be active until marriage.  She c/o nose bleeds, only on the left, don't last long.   GYNECOLOGIC HISTORY: Patient's last menstrual period was 03/15/2016. Contraception:none  Menopausal hormone therapy: none        OB History    Gravida Para Term Preterm AB Living   0 0 0 0 0 0   SAB TAB Ectopic Multiple Live Births   0 0 0 0           Patient Active Problem List   Diagnosis Date Noted  . Hip pain 03/18/2015  . Dysmenorrhea 03/18/2015    No past medical history on file.  No past surgical history on file.  No current outpatient prescriptions on file.   No current facility-administered medications for this visit.      ALLERGIES: Patient has no known allergies.  Family History  Problem Relation Age of Onset  . Healthy Mother   . Healthy Father   . Hypertension Maternal Grandmother   . Hyperlipidemia Maternal Grandmother   . Diabetes Maternal Grandfather   . Hypertension Paternal Grandmother   . Kidney disease Paternal Grandfather     Social History   Social History  . Marital status: Single    Spouse name: N/A  . Number of children: 0  . 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 Smoking status: Never Smoker  . Smokeless tobacco: Never Used  . Alcohol use No  . Drug use: No  . Sexual activity: No   Other Topics Concern  . Not on file   Social History Narrative   Fun: Write   Denies abuse and feels safe at home.     Review of Systems  Constitutional: Negative.    HENT: Positive for nosebleeds.   Eyes: Negative.   Respiratory: Negative.   Cardiovascular: Negative.   Gastrointestinal: Negative.   Genitourinary:       Dysmenorrhea   Musculoskeletal: Negative.   Skin: Negative.   Neurological: Negative.   Endo/Heme/Allergies: Negative.        Cold intolerance   Psychiatric/Behavioral: Negative.     PHYSICAL EXAMINATION:    BP 92/60 (BP Location: Right Arm, Patient Position: Sitting, Cuff Size: Normal)   Pulse 64   Resp 14   Wt 117 lb (53.1 kg)   LMP 03/15/2016   BMI 21.06 kg/m     General appearance: alert, cooperative and appears stated age  ASSESSMENT Severe dysmenorrhea Never sexually active  Nose bleeds, no bleeding from her gums or excessive bleeding when she cuts herself.     PLAN Will start OCP's, no contraindications, risks reviewed F/u for annual exam around her 21st birthday F/U with primary MD for nose bleeds   An After Visit Summary was printed and given to the patient.

## 2016-03-21 NOTE — Patient Instructions (Signed)
Oral Contraception Information Oral contraceptive pills (OCPs) are medicines taken to prevent pregnancy. OCPs work by preventing the ovaries from releasing eggs. The hormones in OCPs also cause the cervical mucus to thicken, preventing the sperm from entering the uterus. The hormones also cause the uterine lining to become thin, not allowing a fertilized egg to attach to the inside of the uterus. OCPs are highly effective when taken exactly as prescribed. However, OCPs do not prevent sexually transmitted diseases (STDs). Safe sex practices, such as using condoms along with the pill, can help prevent STDs. Before taking the pill, you may have a physical exam and Pap test. Your health care provider may order blood tests. The health care provider will make sure you are a good candidate for oral contraception. Discuss with your health care provider the possible side effects of the OCP you may be prescribed. When starting an OCP, it can take 2 to 3 months for the body to adjust to the changes in hormone levels in your body. Types of oral contraception  The combination pill-This pill contains estrogen and progestin (synthetic progesterone) hormones. The combination pill comes in 21-day, 28-day, or 91-day packs. Some types of combination pills are meant to be taken continuously (365-day pills). With 21-day packs, you do not take pills for 7 days after the last pill. With 28-day packs, the pill is taken every day. The last 7 pills are without hormones. Certain types of pills have more than 21 hormone-containing pills. With 91-day packs, the first 84 pills contain both hormones, and the last 7 pills contain no hormones or contain estrogen only.  The minipill-This pill contains the progesterone hormone only. The pill is taken every day continuously. It is very important to take the pill at the same time each day. The minipill comes in packs of 28 pills. All 28 pills contain the hormone. Advantages of oral  contraceptive pills  Decreases premenstrual symptoms.  Treats menstrual period cramps.  Regulates the menstrual cycle.  Decreases a heavy menstrual flow.  May treatacne, depending on the type of pill.  Treats abnormal uterine bleeding.  Treats polycystic ovarian syndrome.  Treats endometriosis.  Can be used as emergency contraception. Things that can make oral contraceptive pills less effective OCPs can be less effective if:  You forget to take the pill at the same time every day.  You have a stomach or intestinal disease that lessens the absorption of the pill.  You take OCPs with other medicines that make OCPs less effective, such as antibiotics, certain HIV medicines, and some seizure medicines.  You take expired OCPs.  You forget to restart the pill on day 7, when using the packs of 21 pills. Risks associated with oral contraceptive pills Oral contraceptive pills can sometimes cause side effects, such as:  Headache.  Nausea.  Breast tenderness.  Irregular bleeding or spotting. Combination pills are also associated with a small increased risk of:  Blood clots.  Heart attack.  Stroke. This information is not intended to replace advice given to you by your health care provider. Make sure you discuss any questions you have with your health care provider. Document Released: 03/26/2002 Document Revised: 06/11/2015 Document Reviewed: 06/24/2012 Elsevier Interactive Patient Education  2017 Elsevier Inc.  

## 2016-07-18 ENCOUNTER — Ambulatory Visit (INDEPENDENT_AMBULATORY_CARE_PROVIDER_SITE_OTHER): Payer: BLUE CROSS/BLUE SHIELD | Admitting: Obstetrics and Gynecology

## 2016-07-18 ENCOUNTER — Encounter: Payer: Self-pay | Admitting: Obstetrics and Gynecology

## 2016-07-18 ENCOUNTER — Other Ambulatory Visit (HOSPITAL_COMMUNITY)
Admission: RE | Admit: 2016-07-18 | Discharge: 2016-07-18 | Disposition: A | Discharge status: Home / Self Care | Payer: BLUE CROSS/BLUE SHIELD | Source: Ambulatory Visit | Attending: Obstetrics and Gynecology | Admitting: Obstetrics and Gynecology

## 2016-07-18 VITALS — BP 90/56 | HR 80 | Resp 14 | Ht 62.5 in | Wt 113.0 lb

## 2016-07-18 DIAGNOSIS — Z01419 Encounter for gynecological examination (general) (routine) without abnormal findings: Secondary | ICD-10-CM

## 2016-07-18 DIAGNOSIS — Z124 Encounter for screening for malignant neoplasm of cervix: Secondary | ICD-10-CM

## 2016-07-18 DIAGNOSIS — Z0000 Encounter for general adult medical examination without abnormal findings: Secondary | ICD-10-CM

## 2016-07-18 DIAGNOSIS — N946 Dysmenorrhea, unspecified: Secondary | ICD-10-CM | POA: Diagnosis not present

## 2016-07-18 MED ORDER — NAPROXEN SODIUM 550 MG PO TABS: 550.0000 mg | ORAL_TABLET | Freq: Two times a day (BID) | ORAL | 2 refills | Status: DC

## 2016-07-18 MED ORDER — NORETHIN ACE-ETH ESTRAD-FE 1-20 MG-MCG PO TABS
1.0000 | ORAL_TABLET | Freq: Every day | ORAL | 3 refills | Status: DC
Start: 2016-07-18 — End: 2016-08-08

## 2016-07-18 NOTE — Patient Instructions (Signed)
EXERCISE AND DIET:  We recommended that you start or continue a regular exercise program for good health. Regular exercise means any activity that makes your heart beat faster and makes you sweat.  We recommend exercising at least 30 minutes per day at least 3 days a week, preferably 4 or 5.  We also recommend a diet low in fat and sugar.  Inactivity, poor dietary choices and obesity can cause diabetes, heart attack, stroke, and kidney damage, among others.    ALCOHOL AND SMOKING:  Women should limit their alcohol intake to no more than 7 drinks/beers/glasses of wine (combined, not each!) per week. Moderation of alcohol intake to this level decreases your risk of breast cancer and liver damage. And of course, no recreational drugs are part of a healthy lifestyle.  And absolutely no smoking or even second hand smoke. Most people know smoking can cause heart and lung diseases, but did you know it also contributes to weakening of your bones? Aging of your skin?  Yellowing of your teeth and nails?  CALCIUM AND VITAMIN D:  Adequate intake of calcium and Vitamin D are recommended.  The recommendations for exact amounts of these supplements seem to change often, but generally speaking 600 mg of calcium (either carbonate or citrate) and 800 units of Vitamin D per day seems prudent. Certain women may benefit from higher intake of Vitamin D.  If you are among these women, your doctor will have told you during your visit.    PAP SMEARS:  Pap smears, to check for cervical cancer or precancers,  have traditionally been done yearly, although recent scientific advances have shown that most women can have pap smears less often.  However, every woman still should have a physical exam from her gynecologist every year. It will include a breast check, inspection of the vulva and vagina to check for abnormal growths or skin changes, a visual exam of the cervix, and then an exam to evaluate the size and shape of the uterus and  ovaries.  And after 21 years of age, a rectal exam is indicated to check for rectal cancers. We will also provide age appropriate advice regarding health maintenance, like when you should have certain vaccines, screening for sexually transmitted diseases, bone density testing, colonoscopy, mammograms, etc.    Breast Self-Awareness Breast self-awareness means being familiar with how your breasts look and feel. It involves checking your breasts regularly and reporting any changes to your health care provider. Practicing breast self-awareness is important. A change in your breasts can be a sign of a serious medical problem. Being familiar with how your breasts look and feel allows you to find any problems early, when treatment is more likely to be successful. All women should practice breast self-awareness, including women who have had breast implants. How to do a breast self-exam One way to learn what is normal for your breasts and whether your breasts are changing is to do a breast self-exam. To do a breast self-exam: Look for Changes  1. Remove all the clothing above your waist. 2. Stand in front of a mirror in a room with good lighting. 3. Put your hands on your hips. 4. Push your hands firmly downward. 5. Compare your breasts in the mirror. Look for differences between them (asymmetry), such as: ? Differences in shape. ? Differences in size. ? Puckers, dips, and bumps in one breast and not the other. 6. Look at each breast for changes in your skin, such as: ? Redness. ?   Scaly areas. 7. Look for changes in your nipples, such as: ? Discharge. ? Bleeding. ? Dimpling. ? Redness. ? A change in position. Feel for Changes  Carefully feel your breasts for lumps and changes. It is best to do this while lying on your back on the floor and again while sitting or standing in the shower or tub with soapy water on your skin. Feel each breast in the following way:  Place the arm on the side of the  breast you are examining above your head.  Feel your breast with the other hand.  Start in the nipple area and make  inch (2 cm) overlapping circles to feel your breast. Use the pads of your three middle fingers to do this. Apply light pressure, then medium pressure, then firm pressure. The light pressure will allow you to feel the tissue closest to the skin. The medium pressure will allow you to feel the tissue that is a little deeper. The firm pressure will allow you to feel the tissue close to the ribs.  Continue the overlapping circles, moving downward over the breast until you feel your ribs below your breast.  Move one finger-width toward the center of the body. Continue to use the  inch (2 cm) overlapping circles to feel your breast as you move slowly up toward your collarbone.  Continue the up and down exam using all three pressures until you reach your armpit.  Write Down What You Find  Write down what is normal for each breast and any changes that you find. Keep a written record with breast changes or normal findings for each breast. By writing this information down, you do not need to depend only on memory for size, tenderness, or location. Write down where you are in your menstrual cycle, if you are still menstruating. If you are having trouble noticing differences in your breasts, do not get discouraged. With time you will become more familiar with the variations in your breasts and more comfortable with the exam. How often should I examine my breasts? Examine your breasts every month. If you are breastfeeding, the best time to examine your breasts is after a feeding or after using a breast pump. If you menstruate, the best time to examine your breasts is 5-7 days after your period is over. During your period, your breasts are lumpier, and it may be more difficult to notice changes. When should I see my health care provider? See your health care provider if you notice:  A change  in shape or size of your breasts or nipples.  A change in the skin of your breast or nipples, such as a reddened or scaly area.  Unusual discharge from your nipples.  A lump or thick area that was not there before.  Pain in your breasts.  Anything that concerns you.  This information is not intended to replace advice given to you by your health care provider. Make sure you discuss any questions you have with your health care provider. Document Released: 01/03/2005 Document Revised: 06/11/2015 Document Reviewed: 11/23/2014 Elsevier Interactive Patient Education  2018 Elsevier Inc.   

## 2016-07-18 NOTE — Progress Notes (Signed)
21 y.o. G0P0000 SingleAfrican AmericanF here for annual exam.  She is on OCP's for dysmenorrhea. Cramps are better on OCP's. Anaprox helps. No plans to be sexually active.  Period Cycle (Days): 28 Period Duration (Days): 5 days  Period Pattern: Regular Menstrual Flow: Moderate Menstrual Control: Maxi pad Menstrual Control Change Freq (Hours): changes pad every 3-4 hours  Dysmenorrhea: (!) Moderate Dysmenorrhea Symptoms: Cramping  Patient's last menstrual period was 07/06/2016.          Sexually active: No.  The current method of family planning is none.    Exercising: Yes.    walking Smoker:  no  Health Maintenance: Pap:  Never TDaP:  09-04-06 Gardasil: completed all 3    reports that she has never smoked. She has never used smokeless tobacco. She reports that she does not drink alcohol or use drugs. Still working at [employer], going to school. Going to [employer]. Graduates in 2 years.   No past medical history on file.  No past surgical history on file.  Current Outpatient Prescriptions  Medication Sig Dispense Refill  . naproxen sodium (ANAPROX DS) 550 MG tablet Take 1 tablet (550 mg total) by mouth 2 (two) times daily with a meal. 30 tablet 2  . norethindrone-ethinyl estradiol (JUNEL FE,GILDESS FE,LOESTRIN FE) 1-20 MG-MCG tablet Take 1 tablet by mouth daily. (Patient not taking: Reported on 07/18/2016) 3 Package 1   No current facility-administered medications for this visit.     Family History  Problem Relation Age of Onset  . Healthy Mother   . Healthy Father   . Hypertension Maternal Grandmother   . Hyperlipidemia Maternal Grandmother   . Diabetes Maternal Grandfather   . Hypertension Paternal Grandmother   . Kidney disease Paternal Grandfather     Review of Systems  Constitutional: Negative.   HENT: Negative.   Eyes: Negative.   Respiratory: Negative.   Cardiovascular: Negative.   Gastrointestinal: Negative.   Endocrine: Negative.   Genitourinary: Negative.    Musculoskeletal: Negative.   Skin: Negative.   Allergic/Immunologic: Negative.   Neurological: Negative.   Psychiatric/Behavioral: Negative.     Exam:   BP (!) 90/56 (BP Location: Right Arm, Patient Position: Sitting, Cuff Size: Normal)   Pulse 80   Resp 14   Ht 5' 2.5" (1.588 m)   Wt 113 lb (51.3 kg)   LMP 07/06/2016   BMI 20.34 kg/m   Weight change: @WEIGHTCHANGE @ Height:   Height: 5' 2.5" (158.8 cm)  Ht Readings from Last 3 Encounters:  07/18/16 5' 2.5" (1.588 m)  07/08/15 5' 2.5" (1.588 m) (24 %, Z= -0.71)*  04/29/15 5' 2.5" (1.588 m) (24 %, Z= -0.71)*   * Growth percentiles are based on CDC 2-20 Years data.    General appearance: alert, cooperative and appears stated age Head: Normocephalic, without obvious abnormality, atraumatic Neck: no adenopathy, supple, symmetrical, trachea midline and thyroid normal to inspection and palpation Lungs: clear to auscultation bilaterally Cardiovascular: regular rate and rhythm Breasts: normal appearance, no masses or tenderness Abdomen: soft, non-tender; bowel sounds normal; no masses,  no organomegaly Extremities: extremities normal, atraumatic, no cyanosis or edema Skin: Skin color, texture, turgor normal. No rashes or lesions Lymph nodes: Cervical, supraclavicular, and axillary nodes normal. No abnormal inguinal nodes palpated Neurologic: Grossly normal   Pelvic: External genitalia:  no lesions              Urethra:  normal appearing urethra with no masses, tenderness or lesions  Bartholins and Skenes: normal                 Vagina: normal appearing vagina with normal color and discharge, no lesions              Cervix: the patient was very uncomfortable with the pediatric speculum, only the anterior lip of the cervix was seen.                Bimanual Exam:  Uterus:  normal size, contour, position, consistency, mobility, non-tender and exam was slightly limited by patient tensing              Adnexa: no mass,  fullness, tenderness               Rectovaginal: deferred  Chaperone was present for exam.  A:  Well Woman with normal exam  Dysmenorrhea, controlled with OCP's and anaprox  P:   Pap   No STD testing needed  Continue OCP's and Anaprox for cramps  Discussed breast self awareness  Discussed calcium and vit D intake  Screening labs

## 2016-07-19 ENCOUNTER — Telehealth: Payer: Self-pay | Admitting: *Deleted

## 2016-07-19 ENCOUNTER — Other Ambulatory Visit: Payer: Self-pay | Admitting: *Deleted

## 2016-07-19 DIAGNOSIS — D729 Disorder of white blood cells, unspecified: Secondary | ICD-10-CM

## 2016-07-19 LAB — CBC
HEMOGLOBIN: 12.9 g/dL (ref 11.1–15.9)
Hematocrit: 39.4 % (ref 34.0–46.6)
MCH: 30.5 pg (ref 26.6–33.0)
MCHC: 32.7 g/dL (ref 31.5–35.7)
MCV: 93 fL (ref 79–97)
PLATELETS: 210 10*3/uL (ref 150–379)
RBC: 4.23 x10E6/uL (ref 3.77–5.28)
RDW: 13 % (ref 12.3–15.4)
WBC: 3 10*3/uL — ABNORMAL LOW (ref 3.4–10.8)

## 2016-07-19 LAB — COMPREHENSIVE METABOLIC PANEL
A/G RATIO: 1.5 (ref 1.2–2.2)
ALBUMIN: 4.7 g/dL (ref 3.5–5.5)
ALK PHOS: 72 IU/L (ref 39–117)
ALT: 9 IU/L (ref 0–32)
AST: 19 IU/L (ref 0–40)
BILIRUBIN TOTAL: 1.1 mg/dL (ref 0.0–1.2)
BUN / CREAT RATIO: 15 (ref 9–23)
BUN: 12 mg/dL (ref 6–20)
CHLORIDE: 102 mmol/L (ref 96–106)
CO2: 26 mmol/L (ref 20–29)
Calcium: 9.4 mg/dL (ref 8.7–10.2)
Creatinine, Ser: 0.79 mg/dL (ref 0.57–1.00)
GFR calc non Af Amer: 107 mL/min/{1.73_m2} (ref >59)
GFR, EST AFRICAN AMERICAN: 124 mL/min/{1.73_m2} (ref >59)
GLUCOSE: 77 mg/dL (ref 65–99)
Globulin, Total: 3.1 g/dL (ref 1.5–4.5)
POTASSIUM: 4.2 mmol/L (ref 3.5–5.2)
Sodium: 140 mmol/L (ref 134–144)
TOTAL PROTEIN: 7.8 g/dL (ref 6.0–8.5)

## 2016-07-19 LAB — CYTOLOGY - PAP: Diagnosis: NEGATIVE

## 2016-07-19 LAB — LIPID PANEL
CHOLESTEROL TOTAL: 120 mg/dL (ref 100–199)
Chol/HDL Ratio: 2.7 ratio (ref 0.0–4.4)
HDL: 45 mg/dL (ref >39)
LDL Calculated: 63 mg/dL (ref 0–99)
Triglycerides: 62 mg/dL (ref 0–149)
VLDL CHOLESTEROL CAL: 12 mg/dL (ref 5–40)

## 2016-07-19 NOTE — Telephone Encounter (Signed)
Left message to call for lab results-eh  

## 2016-07-19 NOTE — Telephone Encounter (Signed)
-----   Message from Romualdo BolkJill Evelyn Jertson, MD sent at 07/19/2016 12:58 PM EDT ----- Please let the patient know that her WBC is slightly low. She should return for a CBC with diff in 2 weeks. If it is still low, we will send her on for further evaluation. Reassure her that most of the time when we repeat this it's normal. The rest of her blood work was normal. Pap is pending.

## 2016-07-19 NOTE — Telephone Encounter (Signed)
Spoke with patient, advised as seen below per Dr. Oscar LaJertson. Patient scheduled for lab appointment on 7/17 at 3:30pm. Patient verbalizes understanding and is agreeable.   Routing to provider for final review. Patient is agreeable to disposition. Will close encounter.

## 2016-08-02 ENCOUNTER — Other Ambulatory Visit (INDEPENDENT_AMBULATORY_CARE_PROVIDER_SITE_OTHER): Payer: BLUE CROSS/BLUE SHIELD

## 2016-08-02 DIAGNOSIS — D729 Disorder of white blood cells, unspecified: Secondary | ICD-10-CM

## 2016-08-03 LAB — CBC WITH DIFFERENTIAL/PLATELET
Basophils Absolute: 0 10*3/uL (ref 0.0–0.2)
Basos: 0 %
EOS (ABSOLUTE): 0 10*3/uL (ref 0.0–0.4)
Eos: 1 %
Hematocrit: 37.4 % (ref 34.0–46.6)
Hemoglobin: 12.8 g/dL (ref 11.1–15.9)
IMMATURE GRANS (ABS): 0 10*3/uL (ref 0.0–0.1)
Immature Granulocytes: 0 %
LYMPHS ABS: 1.5 10*3/uL (ref 0.7–3.1)
LYMPHS: 34 %
MCH: 30 pg (ref 26.6–33.0)
MCHC: 34.2 g/dL (ref 31.5–35.7)
MCV: 88 fL (ref 79–97)
Monocytes Absolute: 0.4 10*3/uL (ref 0.1–0.9)
Monocytes: 10 %
NEUTROS ABS: 2.4 10*3/uL (ref 1.4–7.0)
Neutrophils: 55 %
PLATELETS: 201 10*3/uL (ref 150–379)
RBC: 4.26 x10E6/uL (ref 3.77–5.28)
RDW: 13.2 % (ref 12.3–15.4)
WBC: 4.3 10*3/uL (ref 3.4–10.8)

## 2016-08-08 ENCOUNTER — Encounter: Payer: Self-pay | Admitting: Obstetrics and Gynecology

## 2016-08-08 ENCOUNTER — Ambulatory Visit (INDEPENDENT_AMBULATORY_CARE_PROVIDER_SITE_OTHER): Payer: BLUE CROSS/BLUE SHIELD | Admitting: Obstetrics and Gynecology

## 2016-08-08 VITALS — BP 90/50 | HR 88 | Resp 14 | Wt 115.0 lb

## 2016-08-08 DIAGNOSIS — Z3041 Encounter for surveillance of contraceptive pills: Secondary | ICD-10-CM | POA: Diagnosis not present

## 2016-08-08 DIAGNOSIS — N898 Other specified noninflammatory disorders of vagina: Secondary | ICD-10-CM

## 2016-08-08 DIAGNOSIS — L700 Acne vulgaris: Secondary | ICD-10-CM | POA: Diagnosis not present

## 2016-08-08 MED ORDER — DROSPIRENONE-ETHINYL ESTRADIOL 3-0.02 MG PO TABS: 1.0000 | ORAL_TABLET | Freq: Every day | ORAL | 3 refills | Status: DC

## 2016-08-08 NOTE — Progress Notes (Signed)
GYNECOLOGY  VISIT   HPI: 21 y.o.   Single  African American  female   G0P0000 with Patient's last menstrual period was 08/03/2016.   here c/o vaginal discharge and odor X 7 days. The d/c is orange, the odor is better since her cycle started, but is still there. She was itching, not anymore after switching her soap.    She feels her acne has gotten worse with the the loestrin 1/20. It does help her cramps. She has never been sexually active.    GYNECOLOGIC HISTORY: Patient's last menstrual period was 08/03/2016. Contraception:OCP for cramps. Abstaining. Menopausal hormone therapy: none         OB History    Gravida Para Term Preterm AB Living   0 0 0 0 0 0   SAB TAB Ectopic Multiple Live Births   0 0 0 0           Patient Active Problem List   Diagnosis Date Noted  . Hip pain 03/18/2015  . Dysmenorrhea 03/18/2015    No past medical history on file.  No past surgical history on file.  Current Outpatient Prescriptions  Medication Sig Dispense Refill  . naproxen sodium (ANAPROX DS) 550 MG tablet Take 1 tablet (550 mg total) by mouth 2 (two) times daily with a meal. 30 tablet 2  . norethindrone-ethinyl estradiol (JUNEL FE,GILDESS FE,LOESTRIN FE) 1-20 MG-MCG tablet Take 1 tablet by mouth daily. 3 Package 3   No current facility-administered medications for this visit.      ALLERGIES: Patient has no known allergies.  Family History  Problem Relation Age of Onset  . Healthy Mother   . Healthy Father   . Hypertension Maternal Grandmother   . Hyperlipidemia Maternal Grandmother   . Diabetes Maternal Grandfather   . Hypertension Paternal Grandmother   . Kidney disease Paternal Grandfather     Social History   Social History  . Marital status: Single    Spouse name: N/A  . Number of children: 0  . Years of education: 2313   Occupational History  . [employer]    Social History Main Topics  . Smoking status: Never Smoker  . Smokeless tobacco: Never Used  .  Alcohol use No  . Drug use: No  . Sexual activity: No   Other Topics Concern  . Not on file   Social History Narrative   Fun: Write   Denies abuse and feels safe at home.     Review of Systems  Constitutional: Negative.   HENT: Negative.   Eyes: Negative.   Respiratory: Negative.   Cardiovascular: Negative.   Gastrointestinal: Negative.   Genitourinary:       Vaginal discharge and odor  Musculoskeletal: Negative.   Skin: Negative.   Neurological: Negative.   Endo/Heme/Allergies: Negative.   Psychiatric/Behavioral: Negative.     PHYSICAL EXAMINATION:    BP (!) 90/50 (BP Location: Right Arm, Patient Position: Sitting, Cuff Size: Normal)   Pulse 88   Resp 14   Wt 115 lb (52.2 kg)   LMP 08/03/2016   BMI 20.70 kg/m     General appearance: alert, cooperative and appears stated age  Pelvic: External genitalia:  no lesions              Urethra:  normal appearing urethra with no masses, tenderness or lesions              Bartholins and Skenes: normal  Vagina: normal appearing vagina with normal color and small amount of blood, no clear discharge              Cervix: not well seen, used pediatric speculum, patient uncomfortable with exam  Chaperone was present for exam.  ASSESSMENT Vaginal d/c and odor Worsening acne on OCP's On OCP's for severe dysmenorrhea    PLAN Affirm sent Treatment depending upon results Will stop loestrin and start Yaz, discussed the increased risk of blood clots on Yaz, patient understands    An After Visit Summary was printed and given to the patient.

## 2016-08-09 LAB — VAGINITIS/VAGINOSIS, DNA PROBE
CANDIDA SPECIES: NEGATIVE
GARDNERELLA VAGINALIS: NEGATIVE
Trichomonas vaginosis: NEGATIVE

## 2016-09-26 ENCOUNTER — Telehealth: Payer: Self-pay | Admitting: Obstetrics and Gynecology

## 2016-09-26 NOTE — Telephone Encounter (Signed)
Patient has a very painful bump on her leg near vagina.

## 2016-09-26 NOTE — Telephone Encounter (Signed)
Spoke with patient. Reports painful, hard bump on right groin. Noticed today. Unable to describe size. Thinks may be "hair bump" from shaving.   Denies redness, drainage, warmth, fever/chills.   Recommended OV for further evaluation. Patient scheduled for OV on 9/11 at 8:15am with Dr. Oscar LaJertson. Advised patient to apply warm compress for comfort. Will review with Dr. Oscar LaJertson and return call with any additional recommendations. Patient verbalizes understanding and is agreeable.   Routing to provider for final review. Patient is agreeable to disposition. Will close encounter.

## 2016-09-27 ENCOUNTER — Encounter: Payer: Self-pay | Admitting: Obstetrics and Gynecology

## 2016-09-27 ENCOUNTER — Ambulatory Visit (INDEPENDENT_AMBULATORY_CARE_PROVIDER_SITE_OTHER): Payer: BLUE CROSS/BLUE SHIELD | Admitting: Obstetrics and Gynecology

## 2016-09-27 VITALS — BP 92/58 | HR 84 | Resp 14 | Wt 114.0 lb

## 2016-09-27 DIAGNOSIS — L0292 Furuncle, unspecified: Secondary | ICD-10-CM

## 2016-09-27 NOTE — Progress Notes (Signed)
GYNECOLOGY  VISIT   HPI: 21 y.o.   Single  African American  female   G0P0000 with Patient's last menstrual period was 09/10/2016.   here c/o vaginal bump on right upper thigh. It has been hurting for the last few days, noticed a bump yesterday. Tender, no drainage.    GYNECOLOGIC HISTORY: Patient's last menstrual period was 09/10/2016. Contraception:OCP Menopausal hormone therapy: none         OB History    Gravida Para Term Preterm AB Living   0 0 0 0 0 0   SAB TAB Ectopic Multiple Live Births   0 0 0 0           Patient Active Problem List   Diagnosis Date Noted  . Hip pain 03/18/2015  . Dysmenorrhea 03/18/2015    No past medical history on file.  No past surgical history on file.  Current Outpatient Prescriptions  Medication Sig Dispense Refill  . drospirenone-ethinyl estradiol (YAZ,GIANVI,LORYNA) 3-0.02 MG tablet Take 1 tablet by mouth daily. 3 Package 3  . naproxen sodium (ANAPROX DS) 550 MG tablet Take 1 tablet (550 mg total) by mouth 2 (two) times daily with a meal. 30 tablet 2   No current facility-administered medications for this visit.      ALLERGIES: Patient has no known allergies.  Family History  Problem Relation Age of Onset  . Healthy Mother   . Healthy Father   . Hypertension Maternal Grandmother   . Hyperlipidemia Maternal Grandmother   . Diabetes Maternal Grandfather   . Hypertension Paternal Grandmother   . Kidney disease Paternal Grandfather     Social History   Social History  . Marital status: Single    Spouse name: N/A  . Number of children: 0  . Years of education: 3313   Occupational History  . [employer]    Social History Main Topics  . Smoking status: Never Smoker  . Smokeless tobacco: Never Used  . Alcohol use No  . Drug use: No  . Sexual activity: No   Other Topics Concern  . Not on file   Social History Narrative   Fun: Write   Denies abuse and feels safe at home.     Review of Systems   Constitutional: Negative.   HENT: Negative.   Eyes: Negative.   Respiratory: Negative.   Cardiovascular: Negative.   Gastrointestinal: Negative.   Genitourinary:       Bump on right labia   Musculoskeletal: Negative.   Skin: Negative.   Neurological: Negative.   Endo/Heme/Allergies: Negative.   Psychiatric/Behavioral: Negative.     PHYSICAL EXAMINATION:    BP (!) 92/58 (BP Location: Right Arm, Patient Position: Sitting, Cuff Size: Normal)   Pulse 84   Resp 14   Wt 114 lb (51.7 kg)   LMP 09/10/2016   BMI 20.52 kg/m     General appearance: alert, cooperative and appears stated age Skin: On the right upper thigh is a 1 cm boil, mild erythema, no drainage.   Chaperone was present for exam.  ASSESSMENT Boil right upper thigh    PLAN Warm compresses, hot soaks Call if enlarging or not improving   An After Visit Summary was printed and given to the patient.

## 2016-09-27 NOTE — Patient Instructions (Signed)

## 2016-10-11 ENCOUNTER — Telehealth: Payer: Self-pay | Admitting: Obstetrics and Gynecology

## 2016-10-11 NOTE — Telephone Encounter (Signed)
Spoke with patient. Reports increased urinary frequency and urgency over the last couple of weeks. Flow is sometimes normal and sometimes small amounts. Has increased fluids, unsure if this is related.   Denies pain, blood in urine, N/V/fever, lower back pain.  Recommended OV for further evaluation, advised patient can move appt to 10am on 9/26, patient declined. Will keep appointment as scheduled on 9/26 at 4pm.   Patient asking what she can take in the meantime to help, advised patient can take OTC Azo for symptom relief. Advised this will not treat UTI, only provides symptoms relief, OV still recommended. Advised will review with Dr. Oscar La and return call with any additional recommendations. Patient verbalizes understanding and is agreeable.    Routing to provider for final review. Patient is agreeable to disposition. Will close encounter.

## 2016-10-11 NOTE — Telephone Encounter (Signed)
Left message to call Zailah Zagami at [phone].  

## 2016-10-11 NOTE — Telephone Encounter (Signed)
Patient returning your call.

## 2016-10-11 NOTE — Telephone Encounter (Signed)
Patient called and said, "I am peeing all the time and need an appointment. I might have a UTI, I don't know." Patient scheduled an appointment with Dr. Oscar La on 10/12/16 at 4:00 PM. Routing to triage for [facility] and/or directions for the patient until her appointment.

## 2016-10-12 ENCOUNTER — Encounter: Payer: Self-pay | Admitting: Obstetrics and Gynecology

## 2016-10-12 ENCOUNTER — Ambulatory Visit (INDEPENDENT_AMBULATORY_CARE_PROVIDER_SITE_OTHER): Payer: BLUE CROSS/BLUE SHIELD | Admitting: Obstetrics and Gynecology

## 2016-10-12 VITALS — BP 110/62 | HR 92 | Temp 99.1°F | Resp 16 | Wt 116.0 lb

## 2016-10-12 DIAGNOSIS — R3915 Urgency of urination: Secondary | ICD-10-CM

## 2016-10-12 DIAGNOSIS — R350 Frequency of micturition: Secondary | ICD-10-CM | POA: Diagnosis not present

## 2016-10-12 DIAGNOSIS — R12 Heartburn: Secondary | ICD-10-CM

## 2016-10-12 LAB — POCT URINALYSIS DIPSTICK
Bilirubin, UA: NEGATIVE
Blood, UA: NEGATIVE
Glucose, UA: NEGATIVE
Ketones, UA: NEGATIVE
Nitrite, UA: NEGATIVE
PH UA: 7 (ref 5.0–8.0)
PROTEIN UA: NEGATIVE
Urobilinogen, UA: NEGATIVE E.U./dL — AB

## 2016-10-12 MED ORDER — SULFAMETHOXAZOLE-TRIMETHOPRIM 800-160 MG PO TABS: 1.0000 | ORAL_TABLET | Freq: Two times a day (BID) | ORAL | 0 refills | Status: DC

## 2016-10-12 MED ORDER — PHENAZOPYRIDINE HCL 200 MG PO TABS: 200.0000 mg | ORAL_TABLET | Freq: Three times a day (TID) | ORAL | 0 refills | Status: DC | PRN

## 2016-10-12 NOTE — Patient Instructions (Addendum)
Heartburn Heartburn is a type of pain or discomfort that can happen in the throat or chest. It is often described as a burning pain. It may also cause a bad taste in the mouth. Heartburn may feel worse when you lie down or bend over, and it is often worse at night. Heartburn may be caused by stomach contents that move back up into the esophagus (reflux). Follow these instructions at home: Take these actions to decrease your discomfort and to help avoid complications. Diet  Follow a diet as recommended by your health care provider. This may involve avoiding foods and drinks such as: ? Coffee and tea (with or without caffeine). ? Drinks that contain alcohol. ? Energy drinks and sports drinks. ? Carbonated drinks or sodas. ? Chocolate and cocoa. ? Peppermint and mint flavorings. ? Garlic and onions. ? Horseradish. ? Spicy and acidic foods, including peppers, chili powder, curry powder, vinegar, hot sauces, and barbecue sauce. ? Citrus fruit juices and citrus fruits, such as oranges, lemons, and limes. ? Tomato-based foods, such as red sauce, chili, salsa, and pizza with red sauce. ? Fried and fatty foods, such as donuts, french fries, potato chips, and high-fat dressings. ? High-fat meats, such as hot dogs and fatty cuts of red and white meats, such as rib eye steak, sausage, ham, and bacon. ? High-fat dairy items, such as whole milk, butter, and cream cheese.  Eat small, frequent meals instead of large meals.  Avoid drinking large amounts of liquid with your meals.  Avoid eating meals during the 2-3 hours before bedtime.  Avoid lying down right after you eat.  Do not exercise right after you eat. General instructions  Pay attention to any changes in your symptoms.  Take over-the-counter and prescription medicines only as told by your health care provider. Do not take aspirin, ibuprofen, or other NSAIDs unless your health care provider told you to do so.  Do not use any tobacco  products, including cigarettes, chewing tobacco, and e-cigarettes. If you need help quitting, ask your health care provider.  Wear loose-fitting clothing. Do not wear anything tight around your waist that causes pressure on your abdomen.  Raise (elevate) the head of your bed about 6 inches (15 cm).  Try to reduce your stress, such as with yoga or meditation. If you need help reducing stress, ask your health care provider.  If you are overweight, reduce your weight to an amount that is healthy for you. Ask your health care provider for guidance about a safe weight loss goal.  Keep all follow-up visits as told by your health care provider. This is important. Contact a health care provider if:  You have new symptoms.  You have unexplained weight loss.  You have difficulty swallowing, or it hurts to swallow.  You have wheezing or a persistent cough.  Your symptoms do not improve with treatment.  You have frequent heartburn for more than two weeks. Get help right away if:  You have pain in your arms, neck, jaw, teeth, or back.  You feel sweaty, dizzy, or light-headed.  You have chest pain or shortness of breath.  You vomit and your vomit looks like blood or coffee grounds.  Your stool is bloody or black. This information is not intended to replace advice given to you by your health care provider. Make sure you discuss any questions you have with your health care provider. Document Released: 05/22/2008 Document Revised: 06/11/2015 Document Reviewed: 04/30/2014 Elsevier Interactive Patient Education  2017 Elsevier   Inc[employer] Urinary Tract Infection, Adult A urinary tract infection (UTI) is an infection of any part of the urinary tract, which includes the kidneys, ureters, bladder, and urethra. These organs make, store, and get rid of urine in the body. UTI can be a bladder infection (cystitis) or kidney infection (pyelonephritis). What are the causes? This infection may be caused by  fungi, viruses, or bacteria. Bacteria are the most common cause of UTIs. This condition can also be caused by repeated incomplete emptying of the bladder during urination. What increases the risk? This condition is more likely to develop if:  You ignore your need to urinate or hold urine for long periods of time.  You do not empty your bladder completely during urination.  You wipe back to front after urinating or having a bowel movement, if you are female.  You are uncircumcised, if you are female.  You are constipated.  You have a urinary catheter that stays in place (indwelling).  You have a weak defense (immune) system.  You have a medical condition that affects your bowels, kidneys, or bladder.  You have diabetes.  You take antibiotic medicines frequently or for long periods of time, and the antibiotics no longer work well against certain types of infections (antibiotic resistance).  You take medicines that irritate your urinary tract.  You are exposed to chemicals that irritate your urinary tract.  You are female.  What are the signs or symptoms? Symptoms of this condition include:  Fever.  Frequent urination or passing small amounts of urine frequently.  Needing to urinate urgently.  Pain or burning with urination.  Urine that smells bad or unusual.  Cloudy urine.  Pain in the lower abdomen or back.  Trouble urinating.  Blood in the urine.  Vomiting or being less hungry than normal.  Diarrhea or abdominal pain.  Vaginal discharge, if you are female.  How is this diagnosed? This condition is diagnosed with a medical history and physical exam. You will also need to provide a urine sample to test your urine. Other tests may be done, including:  Blood tests.  Sexually transmitted disease (STD) testing.  If you have had more than one UTI, a cystoscopy or imaging studies may be done to determine the cause of the infections. How is this  treated? Treatment for this condition often includes a combination of two or more of the following:  Antibiotic medicine.  Other medicines to treat less common causes of UTI.  Over-the-counter medicines to treat pain.  Drinking enough water to stay hydrated.  Follow these instructions at home:  Take over-the-counter and prescription medicines only as told by your health care provider.  If you were prescribed an antibiotic, take it as told by your health care provider. Do not stop taking the antibiotic even if you start to feel better.  Avoid alcohol, caffeine, tea, and carbonated beverages. They can irritate your bladder.  Drink enough fluid to keep your urine clear or pale yellow.  Keep all follow-up visits as told by your health care provider. This is important.  Make sure to: ? Empty your bladder often and completely. Do not hold urine for long periods of time. ? Empty your bladder before and after sex. ? Wipe from front to back after a bowel movement if you are female. Use each tissue one time when you wipe. Contact a health care provider if:  You have back pain.  You have a fever.  You feel nauseous or vomit.  Your symptoms  do not get better after 3 days.  Your symptoms go away and then return. Get help right away if:  You have severe back pain or lower abdominal pain.  You are vomiting and cannot keep down any medicines or water. This information is not intended to replace advice given to you by your health care provider. Make sure you discuss any questions you have with your health care provider. Document Released: 10/13/2004 Document Revised: 06/17/2015 Document Reviewed: 11/24/2014 Elsevier Interactive Patient Education  2017 [employer].

## 2016-10-12 NOTE — Progress Notes (Signed)
GYNECOLOGY  VISIT   HPI: 21 y.o.   Single  African American  female   G0P0000 with Patient's last menstrual period was 10/06/2016.   here c/o urinary frequency and urgency  X 2 weeks. Small to normal amounts. No burning, no fever, no back pain.   She is very bothered by symptoms. Not currently sexually active.  She c/o pain in her chest whenever she eats fried foods.   GYNECOLOGIC HISTORY: Patient's last menstrual period was 10/06/2016. Contraception:OCP Menopausal hormone therapy: none         OB History    Gravida Para Term Preterm AB Living   0 0 0 0 0 0   SAB TAB Ectopic Multiple Live Births   0 0 0 0           Patient Active Problem List   Diagnosis Date Noted  . Hip pain 03/18/2015  . Dysmenorrhea 03/18/2015    No past medical history on file.  No past surgical history on file.  Current Outpatient Prescriptions  Medication Sig Dispense Refill  . drospirenone-ethinyl estradiol (YAZ,GIANVI,LORYNA) 3-0.02 MG tablet Take 1 tablet by mouth daily. 3 Package 3  . naproxen sodium (ANAPROX DS) 550 MG tablet Take 1 tablet (550 mg total) by mouth 2 (two) times daily with a meal. 30 tablet 2   No current facility-administered medications for this visit.      ALLERGIES: Patient has no known allergies.  Family History  Problem Relation Age of Onset  . Healthy Mother   . Healthy Father   . Hypertension Maternal Grandmother   . Hyperlipidemia Maternal Grandmother   . Diabetes Maternal Grandfather   . Hypertension Paternal Grandmother   . Kidney disease Paternal Grandfather     Social History   Social History  . Marital status: Single    Spouse name: N/A  . Number of children: 0  . 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 Smoking status: Never Smoker  . Smokeless tobacco: Never Used  . Alcohol use No  . Drug use: No  . Sexual activity: No   Other Topics Concern  . Not on file   Social History  Narrative   Fun: Write   Denies abuse and feels safe at home.     Review of Systems  Constitutional: Negative.   HENT: Negative.   Eyes: Negative.   Respiratory: Negative.   Cardiovascular: Negative.   Gastrointestinal: Negative.   Genitourinary: Positive for frequency.  Musculoskeletal: Negative.   Skin: Negative.   Neurological: Negative.   Endo/Heme/Allergies: Negative.   Psychiatric/Behavioral: Negative.     PHYSICAL EXAMINATION:    BP 110/62 (BP Location: Right Arm, Patient Position: Sitting, Cuff Size: Normal)   Pulse 92   Temp 99.1 F (37.3 C)   Resp 16   Wt 116 lb (52.6 kg)   LMP 10/06/2016   BMI 20.88 kg/m     General appearance: alert, cooperative and appears stated age Abdomen: soft, non-tender; non distended, no masses,  no organomegaly CVA: not tender   ASSESSMENT Suspect UTI Heartburn    PLAN Bactrim  Pyridium Discussed heartburn, information given   An After Visit Summary was printed and given to the patient.

## 2016-10-12 NOTE — Addendum Note (Signed)
Addended by: Lorri Frederick on: 10/12/2016 04:39 PM   Modules accepted: Orders

## 2016-10-13 LAB — URINALYSIS, MICROSCOPIC ONLY: CASTS: NONE SEEN /LPF

## 2016-10-13 LAB — URINE CULTURE

## 2016-11-04 ENCOUNTER — Other Ambulatory Visit: Payer: Self-pay | Admitting: Obstetrics and Gynecology

## 2016-11-04 NOTE — Telephone Encounter (Signed)
Medication refill request: OCP Last AEX:  07/18/16 JJ Next AEX: 07/19/17 JJ Last MMG (if hormonal medication request): none Refill authorized: 08/08/16 #3packs/3R to [facility].  Called patient. Informed she has enough refills at the pharmacy until 07/2017. Patient verbalized understanding.

## 2016-11-04 NOTE — Telephone Encounter (Signed)
Patient is asking for refills of her birth control .Patient needs this refill today if possible. Patient will need to take starting Sunday. Confirmed pharmacy on file.

## 2017-01-05 ENCOUNTER — Telehealth: Payer: Self-pay | Admitting: Obstetrics and Gynecology

## 2017-01-05 NOTE — Telephone Encounter (Signed)
Spoke with patient. Request alternative contraceptive. States she has been on [employer] Nikki for 3-4 months, noticed symptoms of increased "gassiness" and depression one month after starting medication.   States she does not feel like doing anything. Denies SI/HI. Advised should symptoms of SI/HI develop, seek immediate care.   Reports LMP 12/30/16, Cycles are regular and "cramping is way better".   OV recommended for further evaluation and discussion of alternative contraceptives. Patient declined multiple OV offered, states she needs to review schedule with employer and return call on 12/20. Patient verbalizes understanding and is agreeable.   Routing to Dr. Shirley FriarJertson FYI

## 2017-01-05 NOTE — Telephone Encounter (Signed)
Patient states that she wants to stop taking her birth control due to some side effects.

## 2017-01-25 ENCOUNTER — Telehealth: Payer: Self-pay | Admitting: Obstetrics and Gynecology

## 2017-01-25 NOTE — Telephone Encounter (Signed)
Patient would like to change her birth control.

## 2017-01-25 NOTE — Telephone Encounter (Addendum)
Spoke with patient, states she has been on Nikki OCP since 07/2016. Reports increased gas and depression since starting this OCP.  Requesting to change contraceptive, would like to discuss options.   Denies suicidal ideations.   LMP -unsure of exact date, "possibly 12/17".  Reports cycles have improved with Nikki.  In last week of pill pack, 3 days of white, inactive pills left. Patient states she lost a white pill this morning, what should she do? Does not have another pill pack, will pick up from pharmacy on 1/10. Advised to take one pill each day until pack is empty then start new pack.    Recommended OV for further discussion of alternative contraceptives. OV scheduled for 02/02/17 at 3:30pm, patient declined earlier appointments offered d/t work schedule. Advised Dr. Oscar LaJertson will review, I will return call with any additional recommendations, patient is agreeable.   Dr. Oscar LaJertson -any additional recommendations?

## 2017-01-28 NOTE — Telephone Encounter (Signed)
I agree, recommend office visit. Will close the encounter.

## 2017-02-02 ENCOUNTER — Encounter: Payer: Self-pay | Admitting: Obstetrics and Gynecology

## 2017-02-02 ENCOUNTER — Ambulatory Visit (INDEPENDENT_AMBULATORY_CARE_PROVIDER_SITE_OTHER): Payer: BLUE CROSS/BLUE SHIELD | Admitting: Obstetrics and Gynecology

## 2017-02-02 ENCOUNTER — Other Ambulatory Visit: Payer: Self-pay

## 2017-02-02 VITALS — BP 102/60 | HR 76 | Resp 16 | Wt 115.0 lb

## 2017-02-02 DIAGNOSIS — F320 Major depressive disorder, single episode, mild: Secondary | ICD-10-CM | POA: Diagnosis not present

## 2017-02-02 NOTE — Progress Notes (Signed)
GYNECOLOGY  VISIT   HPI: 22 y.o.   Single  African American  female   G0P0000 with Patient's last menstrual period was 01/21/2017.here for discuss alternative contraceptives. Per patient OCP causes headache, cramps, and depression.  The patient has been on OCP's and anaprox for dysmenorrhea. For several months she hasn't been liking her pills, thinks the pills are causing some depression (that is her biggest concern). On OCP's cycles are q month x 5-6 days. Changes her pad every 5-6 hours. With the anaprox cramps are tolerable, better than prior to the pill. Biggest issue is her mood. Sleep habits are terrible. She is working and going to school (on line). Works from 5 am to 2 pm 5 days a week. She goes to sleep as soon as she gets home, sleeps for 2 hours. Goes to bed at 8-9 and gets up at 4 am. Sleeping about 10 hours a day and is still tired. No real social life currently. On weekends she is doing school work or church. Not crying, no thoughts of hurting herself or others. Not spending time with people her age, hasn't been spending time with her friends.  Never sexually active. Hasn't had a boyfriend.   GYNECOLOGIC HISTORY: Patient's last menstrual period was 01/21/2017. Contraception:OCP Menopausal hormone therapy: none        OB History    Gravida Para Term Preterm AB Living   0 0 0 0 0 0   SAB TAB Ectopic Multiple Live Births   0 0 0 0           Patient Active Problem List   Diagnosis Date Noted  . Hip pain 03/18/2015  . Dysmenorrhea 03/18/2015    History reviewed. No pertinent past medical history.  History reviewed. No pertinent surgical history.  Current Outpatient Medications  Medication Sig Dispense Refill  . drospirenone-ethinyl estradiol (YAZ,GIANVI,LORYNA) 3-0.02 MG tablet Take 1 tablet by mouth daily. 3 Package 3  . naproxen sodium (ANAPROX DS) 550 MG tablet Take 1 tablet (550 mg total) by mouth 2 (two) times daily with a meal. 30 tablet 2   No current  facility-administered medications for this visit.      ALLERGIES: Patient has no known allergies.  Family History  Problem Relation Age of Onset  . Healthy Mother   . Healthy Father   . Hypertension Maternal Grandmother   . Hyperlipidemia Maternal Grandmother   . Diabetes Maternal Grandfather   . Hypertension Paternal Grandmother   . Kidney disease Paternal Grandfather     Social History   Socioeconomic History  . Marital status: Single    Spouse name: Not on file  . Number of children: 0  . Years of education: 31  . Highest education level: Not on file  Social Needs  . Financial resource strain: Not on file  . Food insecurity - worry: Not on file  . Food insecurity - inability: Not on file  . Transportation needs - medical: Not on file  . Transportation needs - non-medical: Not on file  Occupational History  . 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 Smoking status: Never Smoker  . Smokeless tobacco: Never Used  Substance and Sexual Activity  . Alcohol use: No    Alcohol/week: 0.0 oz  . Drug use: No  . Sexual activity: No    Birth control/protection: Pill  Other Topics Concern  . Not on file  Social History Narrative   Fun: Write   Denies abuse and feels safe at home.  Review of Systems  Constitutional: Negative.   HENT: Negative.   Eyes: Negative.   Respiratory: Negative.   Cardiovascular: Negative.   Gastrointestinal: Negative.   Genitourinary: Negative.   Musculoskeletal: Negative.   Skin: Negative.   Neurological: Negative.   Endo/Heme/Allergies: Negative.   Psychiatric/Behavioral: Negative.     PHYSICAL EXAMINATION:    BP 102/60 (BP Location: Right Arm, Patient Position: Sitting, Cuff Size: Normal)   Pulse 76   Resp 16   Wt 115 lb (52.2 kg)   LMP 01/21/2017   BMI 20.70 kg/m     General appearance: alert, cooperative and appears stated age  ASSESSMENT Mild depression Dysmenorrhea, so much better on OCP's, currently  tolerable.     PLAN We discussed other potential forms of contraception to control her dysmenorrhea, none are appealing to her. She has decided to stay on her pill We discussed changing jobs, getting out, reconnecting with friends She is also interested in changing schools, going to classes. Thinking about something in the medical field, like becoming a surgical tech, or radiology tech. Given names of counselors, she would like to see someone Discussed the option of starting a SSRI, she declines for now, but will call if she would like to start.    An After Visit Summary was printed and given to the patient.  20 minutes face to face time of which over 50% was spent in counseling.

## 2017-02-15 ENCOUNTER — Encounter: Payer: Self-pay | Admitting: Obstetrics and Gynecology

## 2017-02-15 ENCOUNTER — Other Ambulatory Visit: Payer: Self-pay

## 2017-02-15 ENCOUNTER — Ambulatory Visit: Payer: BLUE CROSS/BLUE SHIELD | Admitting: Obstetrics and Gynecology

## 2017-02-15 VITALS — BP 118/70 | HR 84 | Resp 14 | Wt 114.0 lb

## 2017-02-15 DIAGNOSIS — R3915 Urgency of urination: Secondary | ICD-10-CM | POA: Diagnosis not present

## 2017-02-15 DIAGNOSIS — R350 Frequency of micturition: Secondary | ICD-10-CM

## 2017-02-15 DIAGNOSIS — M545 Low back pain, unspecified: Secondary | ICD-10-CM

## 2017-02-15 DIAGNOSIS — Z3009 Encounter for other general counseling and advice on contraception: Secondary | ICD-10-CM | POA: Diagnosis not present

## 2017-02-15 LAB — POCT URINALYSIS DIPSTICK
Appearance: NORMAL
Bilirubin, UA: NEGATIVE
Glucose, UA: NEGATIVE
KETONES UA: NEGATIVE
NITRITE UA: NEGATIVE
PH UA: 5 (ref 5.0–8.0)
Protein, UA: NEGATIVE
RBC UA: NEGATIVE
UROBILINOGEN UA: NEGATIVE U/dL — AB

## 2017-02-15 MED ORDER — SULFAMETHOXAZOLE-TRIMETHOPRIM 800-160 MG PO TABS: 1.0000 | ORAL_TABLET | Freq: Two times a day (BID) | ORAL | 0 refills | Status: DC

## 2017-02-15 MED ORDER — PHENAZOPYRIDINE HCL 200 MG PO TABS: 200.0000 mg | ORAL_TABLET | Freq: Three times a day (TID) | ORAL | 0 refills | Status: DC | PRN

## 2017-02-15 NOTE — Progress Notes (Signed)
GYNECOLOGY  VISIT   HPI: 22 y.o.   Single  African American  female   G0P0000 with Patient's last menstrual period was 01/21/2017.   here c/o urinary frequency and lower back pain X 5 days.  She has had urinary frequency and urgency x 5-6 days, has lower back pain, but no flank pain or dysuria. She c/o intermittent mid abdominal pain for the last few days, worse in the morning. She has been having 2-3 BM's a day, often has a BM when she eats. Some increase in gas. She notices increased GI upset with dairy or fatty foods. She has never been sexually active. She has tried taking her pills at night, as has been forgetting them. She used to take them in the am, but gets up at very different times depending on the day.  She has had some mild depression, at a recent visit we discussed her trying to change jobs (not happy there). She is actively looking for another job.       GYNECOLOGIC HISTORY: Patient's last menstrual period was 01/21/2017. Contraception:OCP Menopausal hormone therapy: none         OB History    Gravida Para Term Preterm AB Living   0 0 0 0 0 0   SAB TAB Ectopic Multiple Live Births   0 0 0 0           Patient Active Problem List   Diagnosis Date Noted  . Hip pain 03/18/2015  . Dysmenorrhea 03/18/2015    History reviewed. No pertinent past medical history.  History reviewed. No pertinent surgical history.  Current Outpatient Medications  Medication Sig Dispense Refill  . drospirenone-ethinyl estradiol (YAZ,GIANVI,LORYNA) 3-0.02 MG tablet Take 1 tablet by mouth daily. 3 Package 3  . naproxen sodium (ANAPROX DS) 550 MG tablet Take 1 tablet (550 mg total) by mouth 2 (two) times daily with a meal. 30 tablet 2   No current facility-administered medications for this visit.      ALLERGIES: Patient has no known allergies.  Family History  Problem Relation Age of Onset  . Healthy Mother   . Healthy Father   . Hypertension Maternal Grandmother   . Hyperlipidemia  Maternal Grandmother   . Diabetes Maternal Grandfather   . Hypertension Paternal Grandmother   . Kidney disease Paternal Grandfather     Social History   Socioeconomic History  . Marital status: Single    Spouse name: Not on file  . Number of children: 0  . Years of education: 38  . Highest education level: Not on file  Social Needs  . Financial resource strain: Not on file  . Food insecurity - worry: Not on file  . Food insecurity - inability: Not on file  . Transportation needs - medical: Not on file  . Transportation needs - non-medical: Not on file  Occupational History  . 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 Smoking status: Never Smoker  . Smokeless tobacco: Never Used  Substance and Sexual Activity  . Alcohol use: No    Alcohol/week: 0.0 oz  . Drug use: No  . Sexual activity: No    Birth control/protection: Pill  Other Topics Concern  . Not on file  Social History Narrative   Fun: Write   Denies abuse and feels safe at home.     Review of Systems  Constitutional: Negative.   HENT: Negative.   Eyes: Negative.   Respiratory: Negative.   Cardiovascular: Negative.   Gastrointestinal: Negative.  Genitourinary: Positive for frequency.  Musculoskeletal: Positive for back pain.  Skin: Negative.   Neurological: Positive for headaches.  Endo/Heme/Allergies: Negative.   Psychiatric/Behavioral:       Anxiety     PHYSICAL EXAMINATION:    BP 118/70 (BP Location: Right Arm, Patient Position: Sitting, Cuff Size: Normal)   Pulse 84   Resp 14   Wt 114 lb (51.7 kg)   LMP 01/21/2017   BMI 20.52 kg/m     General appearance: alert, cooperative and appears stated age Abdomen: soft, non-tender; non distended, no masses,  no organomegaly CVA: not tender Lower back: not tender   Urine dip: trace leuk  ASSESSMENT Urinary frequency and urgency Intermittent abdominal pain, improves with BM, increased gas. Suspect she may have a lactose  intolerance Contraception management. She is on OCP's for dysmenorrhea    PLAN Send urine for ua, c&s Treat for possible UTI Discussed dietary changes, will try going off of dairy for a few weeks Discussed other options for controlling dysmenorrhea, information given on the kyleena and mirena IUD She will try setting an alarm to remember to take her pills at the same time daily   An After Visit Summary was printed and given to the patient.  ~25 minutes face to face time of which over 50% was spent in counseling.

## 2017-02-15 NOTE — Patient Instructions (Signed)
Abdominal Bloating °When you have abdominal bloating, your abdomen may feel full, tight, or painful. It may also look bigger than normal or swollen (distended). Common causes of abdominal bloating include: °· Swallowing air. °· Constipation. °· Problems digesting food. °· Eating too much. °· Irritable bowel syndrome. This is a condition that affects the large intestine. °· Lactose intolerance. This is an inability to digest lactose, a natural sugar in dairy products. °· Celiac disease. This is a condition that affects the ability to digest gluten, a protein found in some grains. °· Gastroparesis. This is a condition that slows down the movement of food in the stomach and small intestine. It is more common in people with diabetes mellitus. °· Gastroesophageal reflux disease (GERD). This is a digestive condition that makes stomach acid flow back into the esophagus. °· Urinary retention. This means that the body is holding onto urine, and the bladder cannot be emptied all the way. ° °Follow these instructions at home: °Eating and drinking °· Avoid eating too much. °· Try not to swallow air while talking or eating. °· Avoid eating while lying down. °· Avoid these foods and drinks: °? Foods that cause gas, such as broccoli, cabbage, cauliflower, and baked beans. °? Carbonated drinks. °? Hard candy. °? Chewing gum. °Medicines °· Take over-the-counter and prescription medicines only as told by your health care provider. °· Take probiotic medicines. These medicines contain live bacteria or yeasts that can help digestion. °· Take coated peppermint oil capsules. °Activity °· Try to exercise regularly. Exercise may help to relieve bloating that is caused by gas and relieve constipation. °General instructions °· Keep all follow-up visits as told by your health care provider. This is important. °Contact a health care provider if: °· You have nausea and vomiting. °· You have diarrhea. °· You have abdominal pain. °· You have  unusual weight loss or weight gain. °· You have severe pain, and medicines do not help. °Get help right away if: °· You have severe chest pain. °· You have trouble breathing. °· You have shortness of breath. °· You have trouble urinating. °· You have darker urine than normal. °· You have blood in your stools or have dark, tarry stools. °Summary °· Abdominal bloating means that the abdomen is swollen. °· Common causes of abdominal bloating are swallowing air, constipation, and problems digesting food. °· Avoid eating too much and avoid swallowing air. °· Avoid foods that cause gas, carbonated drinks, hard candy, and chewing gum. °This information is not intended to replace advice given to you by your health care provider. Make sure you discuss any questions you have with your health care provider. °Document Released: 02/05/2016 Document Revised: 02/05/2016 Document Reviewed: 02/05/2016 °Elsevier Interactive Patient Education © 2018 [employer] ° °

## 2017-02-16 LAB — URINALYSIS, MICROSCOPIC ONLY: Casts: NONE SEEN /lpf

## 2017-02-17 LAB — URINE CULTURE: ORGANISM ID, BACTERIA: NO GROWTH

## 2017-04-13 ENCOUNTER — Telehealth: Payer: Self-pay | Admitting: Obstetrics and Gynecology

## 2017-04-13 NOTE — Telephone Encounter (Signed)
[name] She said she will call back after 1:00 PM today when she gets off work.

## 2017-04-13 NOTE — Telephone Encounter (Signed)
Left message to call Kaitlyn at [phone]. 

## 2017-04-13 NOTE — Telephone Encounter (Signed)
Spoke with patient. Patient states that for 2-3 weeks she has been having mild pain in her left pelvic area. Intermittent burning with urination and clear vaginal discharge. Denies any fever, chills, or lower back pain. Advised will need to be seen for evaluation. Patient declines appointment tomorrow. Appointment scheduled for 04/18/2017 at 11:30 am with Dr.Jertson. Patient is agreeable to date and time. Advised if symptoms worsen or develops new symptoms will need to be seen for further evaluation with our office or urgent care. Patient is agreeable.  Routing to provider for final review. Patient agreeable to disposition. Will close encounter.

## 2017-04-13 NOTE — Telephone Encounter (Signed)
Patient is having left sided pain and burning with urination.

## 2017-04-14 ENCOUNTER — Ambulatory Visit: Payer: BLUE CROSS/BLUE SHIELD | Admitting: Obstetrics and Gynecology

## 2017-04-18 ENCOUNTER — Other Ambulatory Visit: Payer: Self-pay

## 2017-04-18 ENCOUNTER — Encounter: Payer: Self-pay | Admitting: Obstetrics and Gynecology

## 2017-04-18 ENCOUNTER — Ambulatory Visit: Payer: BLUE CROSS/BLUE SHIELD | Admitting: Obstetrics and Gynecology

## 2017-04-18 VITALS — BP 112/60 | HR 100 | Resp 12 | Wt 114.0 lb

## 2017-04-18 DIAGNOSIS — R350 Frequency of micturition: Secondary | ICD-10-CM

## 2017-04-18 DIAGNOSIS — N898 Other specified noninflammatory disorders of vagina: Secondary | ICD-10-CM | POA: Diagnosis not present

## 2017-04-18 DIAGNOSIS — N946 Dysmenorrhea, unspecified: Secondary | ICD-10-CM | POA: Diagnosis not present

## 2017-04-18 LAB — POCT URINALYSIS DIPSTICK
Bilirubin, UA: NEGATIVE
GLUCOSE UA: NEGATIVE
Ketones, UA: NEGATIVE
Nitrite, UA: NEGATIVE
Protein, UA: NEGATIVE
Urobilinogen, UA: NEGATIVE E.U./dL — AB
pH, UA: 5 (ref 5.0–8.0)

## 2017-04-18 MED ORDER — NAPROXEN SODIUM 550 MG PO TABS: ORAL_TABLET | ORAL | 2 refills | Status: AC

## 2017-04-18 NOTE — Progress Notes (Signed)
GYNECOLOGY  VISIT   HPI: 22 y.o.   Single  African American  female   G0P0000 with Patient's last menstrual period was 04/18/2017.   here c/o vaginal discharge X3 weeks. The d/c is white and creamy. No vaginal irritation, itching or burning She also c/o a 3 week h/o urinary frequency, worse at night. Voiding small to normal amounts. She c/o discomfort when she needs to void, feels better to void. Only one episode of dysuria, but it hasn't persisted.  Some urgency to void.  No fever, no flank pain.  Never sexually active. She stopped her OCP's, feels better off of it.  Just started her first cycle off the pill. Was on OCP's for severe cramps.    GYNECOLOGIC HISTORY: Patient's last menstrual period was 04/18/2017. Contraception:none  Menopausal hormone therapy: none         OB History    Gravida  0   Para  0   Term  0   Preterm  0   AB  0   Living  0     SAB  0   TAB  0   Ectopic  0   Multiple  0   Live Births                 Patient Active Problem List   Diagnosis Date Noted  . Hip pain 03/18/2015  . Dysmenorrhea 03/18/2015    History reviewed. No pertinent past medical history.  History reviewed. No pertinent surgical history.  Current Outpatient Medications  Medication Sig Dispense Refill  . naproxen sodium (ANAPROX DS) 550 MG tablet Take 1 tablet (550 mg total) by mouth 2 (two) times daily with a meal. 30 tablet 2   No current facility-administered medications for this visit.      ALLERGIES: Patient has no known allergies.  Family History  Problem Relation Age of Onset  . Healthy Mother   . Healthy Father   . Hypertension Maternal Grandmother   . Hyperlipidemia Maternal Grandmother   . Diabetes Maternal Grandfather   . Hypertension Paternal Grandmother   . Kidney disease Paternal Grandfather     Social History   Socioeconomic History  . Marital status: Single    Spouse name: Not on file  . Number of children: 0  . Years of education:  46  . Highest education level: Not on file  Occupational History  . Occupation: Magazine features editor  Social Needs  . Financial resource strain: Not on file  . Food insecurity:    Worry: Not on file    Inability: Not on file  . Transportation needs:    Medical: Not on file    Non-medical: Not on file  Tobacco Use  . Smoking status: Never Smoker  . Smokeless tobacco: Never Used  Substance and Sexual Activity  . Alcohol use: No    Alcohol/week: 0.0 oz  . Drug use: No  . Sexual activity: Never    Birth control/protection: Pill  Lifestyle  . Physical activity:    Days per week: Not on file    Minutes per session: Not on file  . Stress: Not on file  Relationships  . Social connections:    Talks on phone: Not on file    Gets together: Not on file    Attends religious service: Not on file    Active member of club or organization: Not on file    Attends meetings of clubs or organizations: Not on file    Relationship status:  Not on file  . Intimate partner violence:    Fear of current or ex partner: Not on file    Emotionally abused: Not on file    Physically abused: Not on file    Forced sexual activity: Not on file  Other Topics Concern  . Not on file  Social History Narrative   Fun: Write   Denies abuse and feels safe at home.     Review of Systems  Constitutional: Negative.   HENT: Negative.   Eyes: Negative.   Respiratory: Negative.   Cardiovascular: Negative.   Gastrointestinal: Negative.   Genitourinary: Positive for frequency.       Vaginal discharge  Musculoskeletal: Negative.   Skin: Negative.   Neurological: Negative.   Endo/Heme/Allergies: Negative.   Psychiatric/Behavioral: Negative.     PHYSICAL EXAMINATION:    BP 112/60 (BP Location: Right Arm, Patient Position: Sitting, Cuff Size: Normal)   Pulse 100   Resp 12   Wt 114 lb (51.7 kg)   LMP 04/18/2017   BMI 20.52 kg/m     General appearance: alert, cooperative and appears stated  age Abdomen: soft, non-tender; non distended, no masses,  no organomegaly CVA: not tender  Pelvic: External genitalia:  no lesions              Urethra:  normal appearing urethra with no masses, tenderness or lesions              Bartholins and Skenes: normal                 Vagina: normal appearing vagina with normal color and discharge, no lesions, + blood              Cervix:not seen, uncomfortable with use of pediatric speculum.  BM: unable to tolerate exam.   Chaperone was present for exam.  Urine dip with trace leuk and blood (on cycle)  ASSESSMENT Vaginal d/c, no other associated symptoms. The d/c started around the time she went off of OCP's, suspect physiologic d/c Urinary frequency and urgency Dysmenorrhea, went off of OCP's, overall feels better. Considering having a mirena IUD placed (I don't think she could tolerate it, has trouble with baseline exams)    PLAN Affirm Urine for ua, c&s Anaprox for pain   An After Visit Summary was printed and given to the patient.

## 2017-04-19 LAB — VAGINITIS/VAGINOSIS, DNA PROBE
Candida Species: NEGATIVE
Gardnerella vaginalis: NEGATIVE
Trichomonas vaginosis: NEGATIVE

## 2017-04-19 LAB — URINALYSIS, MICROSCOPIC ONLY: Casts: NONE SEEN /lpf

## 2017-04-20 LAB — URINE CULTURE

## 2017-06-15 ENCOUNTER — Telehealth: Payer: Self-pay | Admitting: Obstetrics and Gynecology

## 2017-06-15 ENCOUNTER — Encounter: Payer: Self-pay | Admitting: Obstetrics and Gynecology

## 2017-06-15 ENCOUNTER — Other Ambulatory Visit: Payer: Self-pay

## 2017-06-15 ENCOUNTER — Ambulatory Visit: Payer: BLUE CROSS/BLUE SHIELD | Admitting: Obstetrics and Gynecology

## 2017-06-15 VITALS — BP 112/60 | HR 80 | Resp 14 | Wt 116.0 lb

## 2017-06-15 DIAGNOSIS — N764 Abscess of vulva: Secondary | ICD-10-CM | POA: Diagnosis not present

## 2017-06-15 NOTE — Patient Instructions (Signed)

## 2017-06-15 NOTE — Progress Notes (Signed)
GYNECOLOGY  VISIT   HPI: 22 y.o.   Single  African American  female   G0P0000 with Patient's last menstrual period was 06/13/2017.   here c/o vaginal "boil" on the right side. She noticed it a few days ago. She soaked in hot water, thinks it has gotten bigger, not draining, mildly tender.     GYNECOLOGIC HISTORY: Patient's last menstrual period was 06/13/2017. Contraception:none  Menopausal hormone therapy: none         OB History    Gravida  0   Para  0   Term  0   Preterm  0   AB  0   Living  0     SAB  0   TAB  0   Ectopic  0   Multiple  0   Live Births                 Patient Active Problem List   Diagnosis Date Noted  . Hip pain 03/18/2015  . Dysmenorrhea 03/18/2015    History reviewed. No pertinent past medical history.  History reviewed. No pertinent surgical history.  Current Outpatient Medications  Medication Sig Dispense Refill  . naproxen sodium (ANAPROX DS) 550 MG tablet 1 tablet po q 12 hours prn 30 tablet 2   No current facility-administered medications for this visit.      ALLERGIES: Patient has no known allergies.  Family History  Problem Relation Age of Onset  . Healthy Mother   . Healthy Father   . Hypertension Maternal Grandmother   . Hyperlipidemia Maternal Grandmother   . Diabetes Maternal Grandfather   . Hypertension Paternal Grandmother   . Kidney disease Paternal Grandfather     Social History   Socioeconomic History  . Marital status: Single    Spouse name: Not on file  . Number of children: 0  . Years of education: 66  . Highest education level: Not on file  Occupational History  . Occupation: Magazine features editor  Social Needs  . Financial resource strain: Not on file  . Food insecurity:    Worry: Not on file    Inability: Not on file  . Transportation needs:    Medical: Not on file    Non-medical: Not on file  Tobacco Use  . Smoking status: Never Smoker  . Smokeless tobacco: Never Used  Substance  and Sexual Activity  . Alcohol use: No    Alcohol/week: 0.0 oz  . Drug use: No  . Sexual activity: Never    Birth control/protection: Pill  Lifestyle  . Physical activity:    Days per week: Not on file    Minutes per session: Not on file  . Stress: Not on file  Relationships  . Social connections:    Talks on phone: Not on file    Gets together: Not on file    Attends religious service: Not on file    Active member of club or organization: Not on file    Attends meetings of clubs or organizations: Not on file    Relationship status: Not on file  . Intimate partner violence:    Fear of current or ex partner: Not on file    Emotionally abused: Not on file    Physically abused: Not on file    Forced sexual activity: Not on file  Other Topics Concern  . Not on file  Social History Narrative   Fun: Write   Denies abuse and feels safe at home.  Review of Systems  Constitutional: Negative.   HENT: Negative.   Eyes: Negative.   Respiratory: Negative.   Cardiovascular: Negative.   Gastrointestinal: Negative.   Genitourinary:       Vaginal "boil"   Musculoskeletal: Negative.   Skin: Negative.   Neurological: Negative.   Endo/Heme/Allergies: Negative.   Psychiatric/Behavioral: Negative.     PHYSICAL EXAMINATION:    BP 112/60 (BP Location: Right Arm, Patient Position: Sitting, Cuff Size: Normal)   Pulse 80   Resp 14   Wt 116 lb (52.6 kg)   LMP 06/13/2017   BMI 20.88 kg/m     General appearance: alert, cooperative and appears stated age   Pelvic: External genitalia:  Under 1 cm boil on the right vulva, not tense, mildly tender.               Urethra:  normal appearing urethra with no masses, tenderness or lesions              Bartholins and Skenes: normal                  Chaperone was present for exam.  ASSESSMENT Small vulvar boil    PLAN Recommended warm soaks, hot compresses Call if not improving   An After Visit Summary was printed and given to the  patient.

## 2017-06-15 NOTE — Telephone Encounter (Signed)
Patient thinks she may have a "boil" and would like to have an appointment.

## 2017-06-15 NOTE — Telephone Encounter (Signed)
Spoke with patient. Patient states that she has a boil/cyst on her right labia that she noticed 2 days ago. Reports it is getting larger and painful when she walks. Reports it appears fluid filled, but cannot see the area well. Advised will need to be seen for further evaluation. Patient is agreeable.Mikayla Robinson will review with Dr.Jertson and return call.  Returned call to patient after speaking with Dr.Jertson. Appointment scheduled for today at 4:15 pm. Patient is agreeable.  Routing to provider for final review. Patient agreeable to disposition. Will close encounter.

## 2017-07-18 NOTE — Progress Notes (Signed)
22 y.o. G0P0000 SingleAfrican AmericanF here for annual exam.  She was previously on OCP's for dysmenorrhea, but stopped secondary to mild depression in late January. Depression is better off of the pill.  Period Duration (Days): 5-6 days in length Period Pattern: Regular Menstrual Flow: Light Menstrual Control: Maxi pad, Thin pad Menstrual Control Change Freq (Hours): changes pad every 1-2 hours Dysmenorrhea: (!) Moderate Dysmenorrhea Symptoms: Cramping  Pad is not full when she changes it. Cramps are bad for 2 days. Anaprox helps. Currently tolerable. Never sexually active, waiting for marriage   Patient's last menstrual period was 07/10/2017 (exact date).          Sexually active: No. Never.  The current method of family planning is none.    Exercising: Yes.    walking Smoker:  no  Health Maintenance: Pap:  07/18/2016 negative History of abnormal Pap:  no TDaP:  09/04/06 Gardasil: completed all 3   reports that she has never smoked. She has never used smokeless tobacco. She reports that she does not drink alcohol or use drugs. She is working and going to school.   History reviewed. No pertinent past medical history.  History reviewed. No pertinent surgical history.  Current Outpatient Medications  Medication Sig Dispense Refill  . naproxen sodium (ANAPROX DS) 550 MG tablet 1 tablet po q 12 hours prn 30 tablet 2   No current facility-administered medications for this visit.     Family History  Problem Relation Age of Onset  . Healthy Mother   . Healthy Father   . Hypertension Maternal Grandmother   . Hyperlipidemia Maternal Grandmother   . Diabetes Maternal Grandfather   . Hypertension Paternal Grandmother   . Kidney disease Paternal Grandfather     Review of Systems  Constitutional: Negative.   HENT: Positive for sinus pressure.   Eyes: Negative.   Respiratory: Negative.   Cardiovascular: Negative.   Gastrointestinal: Negative.   Endocrine: Negative.    Genitourinary: Negative.   Musculoskeletal: Negative.   Skin: Negative.   Allergic/Immunologic: Negative.   Neurological: Negative.   Hematological: Negative.   Psychiatric/Behavioral: Negative.   She c/o fatigue, always tired, sweats in her sleep (long term)  Exam:   BP 102/73 (BP Location: Right Arm, Patient Position: Sitting)   Pulse 72   Resp 14   Ht 5' 2.5" (1.588 m)   Wt 113 lb 3.2 oz (51.3 kg)   LMP 07/10/2017 (Exact Date)   BMI 20.37 kg/m   Weight change: @WEIGHTCHANGE @ Height:   Height: 5' 2.5" (158.8 cm)  Ht Readings from Last 3 Encounters:  07/19/17 5' 2.5" (1.588 m)  07/18/16 5' 2.5" (1.588 m)  07/08/15 5' 2.5" (1.588 m) (24 %, Z= -0.71)*   * Growth percentiles are based on CDC (Girls, 2-20 Years) data.    General appearance: alert, cooperative and appears stated age Head: Normocephalic, without obvious abnormality, atraumatic Neck: no adenopathy, supple, symmetrical, trachea midline and thyroid normal to inspection and palpation Lungs: clear to auscultation bilaterally Cardiovascular: regular rate and rhythm Breasts: normal appearance, no masses or tenderness Abdomen: soft, non-tender; non distended,  no masses,  no organomegaly Extremities: extremities normal, atraumatic, no cyanosis or edema Skin: Skin color, texture, turgor normal. No rashes or lesions Lymph nodes: Cervical, supraclavicular, and axillary nodes normal. No abnormal inguinal nodes palpated Neurologic: Grossly normal Pelvic deferred  A:  Well Woman with normal exam  Dysmenorrhea, controlled with anaprox (she will call when she needs a refill)  Fatigue  P:   No  pap this year  No STD testing needed  Screening labs (no lipid panel needed)  TSH  Discussed breast self exam  Discussed calcium and vit D intake

## 2017-07-19 ENCOUNTER — Other Ambulatory Visit: Payer: Self-pay

## 2017-07-19 ENCOUNTER — Ambulatory Visit: Payer: BLUE CROSS/BLUE SHIELD | Admitting: Obstetrics and Gynecology

## 2017-07-19 ENCOUNTER — Encounter: Payer: Self-pay | Admitting: Obstetrics and Gynecology

## 2017-07-19 VITALS — BP 102/73 | HR 72 | Resp 14 | Ht 62.5 in | Wt 113.2 lb

## 2017-07-19 DIAGNOSIS — Z01419 Encounter for gynecological examination (general) (routine) without abnormal findings: Secondary | ICD-10-CM

## 2017-07-19 DIAGNOSIS — R5383 Other fatigue: Secondary | ICD-10-CM | POA: Diagnosis not present

## 2017-07-19 DIAGNOSIS — Z0000 Encounter for general adult medical examination without abnormal findings: Secondary | ICD-10-CM

## 2017-07-19 NOTE — Patient Instructions (Signed)
EXERCISE AND DIET:  We recommended that you start or continue a regular exercise program for good health. Regular exercise means any activity that makes your heart beat faster and makes you sweat.  We recommend exercising at least 30 minutes per day at least 22 days a week, preferably 4 or 5.  We also recommend a diet low in fat and sugar.  Inactivity, poor dietary choices and obesity can cause diabetes, heart attack, stroke, and kidney damage, among others.    ALCOHOL AND SMOKING:  Women should limit their alcohol intake to no more than 7 drinks/beers/glasses of wine (combined, not each!) per week. Moderation of alcohol intake to this level decreases your risk of breast cancer and liver damage. And of course, no recreational drugs are part of a healthy lifestyle.  And absolutely no smoking or even second hand smoke. Most people know smoking can cause heart and lung diseases, but did you know it also contributes to weakening of your bones? Aging of your skin?  Yellowing of your teeth and nails?  CALCIUM AND VITAMIN D:  Adequate intake of calcium and Vitamin D are recommended.  The recommendations for exact amounts of these supplements seem to change often, but generally speaking 600 mg of calcium (either carbonate or citrate) and 800 units of Vitamin D per day seems prudent. Certain women may benefit from higher intake of Vitamin D.  If you are among these women, your doctor will have told you during your visit.    PAP SMEARS:  Pap smears, to check for cervical cancer or precancers,  have traditionally been done yearly, although recent scientific advances have shown that most women can have pap smears less often.  However, every woman still should have a physical exam from her gynecologist every year. It will include a breast check, inspection of the vulva and vagina to check for abnormal growths or skin changes, a visual exam of the cervix, and then an exam to evaluate the size and shape of the uterus and  ovaries.  And after 22 years of age, a rectal exam is indicated to check for rectal cancers. We will also provide age appropriate advice regarding health maintenance, like when you should have certain vaccines, screening for sexually transmitted diseases, bone density testing, colonoscopy, mammograms, etc.   MAMMOGRAMS:  All women over 22 years old should have a yearly mammogram. Many facilities now offer a "3D" mammogram, which may cost around $50 extra out of pocket. If possible,  we recommend you accept the option to have the 3D mammogram performed.  It both reduces the number of women who will be called back for extra views which then turn out to be normal, and it is better than the routine mammogram at detecting truly abnormal areas.    COLONOSCOPY:  Colonoscopy to screen for colon cancer is recommended for all women at age 22.  We know, you hate the idea of the prep.  We agree, BUT, having colon cancer and not knowing it is worse!!  Colon cancer so often starts as a polyp that can be seen and removed at colonscopy, which can quite literally save your life!  And if your first colonoscopy is normal and you have no family history of colon cancer, most women don't have to have it again for 10 years.  Once every ten years, you can do something that may end up saving your life, right?  We will be happy to help you get it scheduled when you are ready.    Be sure to check your insurance coverage so you understand how much it will cost.  It may be covered as a preventative service at no cost, but you should check your particular policy.      Breast Self-Awareness Breast self-awareness means being familiar with how your breasts look and feel. It involves checking your breasts regularly and reporting any changes to your health care provider. Practicing breast self-awareness is important. A change in your breasts can be a sign of a serious medical problem. Being familiar with how your breasts look and feel allows  you to find any problems early, when treatment is more likely to be successful. All women should practice breast self-awareness, including women who have had breast implants. How to do a breast self-exam One way to learn what is normal for your breasts and whether your breasts are changing is to do a breast self-exam. To do a breast self-exam: Look for Changes  1. Remove all the clothing above your waist. 2. Stand in front of a mirror in a room with good lighting. 3. Put your hands on your hips. 4. Push your hands firmly downward. 5. Compare your breasts in the mirror. Look for differences between them (asymmetry), such as: ? Differences in shape. ? Differences in size. ? Puckers, dips, and bumps in one breast and not the other. 6. Look at each breast for changes in your skin, such as: ? Redness. ? Scaly areas. 7. Look for changes in your nipples, such as: ? Discharge. ? Bleeding. ? Dimpling. ? Redness. ? A change in position. Feel for Changes  Carefully feel your breasts for lumps and changes. It is best to do this while lying on your back on the floor and again while sitting or standing in the shower or tub with soapy water on your skin. Feel each breast in the following way:  Place the arm on the side of the breast you are examining above your head.  Feel your breast with the other hand.  Start in the nipple area and make  inch (2 cm) overlapping circles to feel your breast. Use the pads of your three middle fingers to do this. Apply light pressure, then medium pressure, then firm pressure. The light pressure will allow you to feel the tissue closest to the skin. The medium pressure will allow you to feel the tissue that is a little deeper. The firm pressure will allow you to feel the tissue close to the ribs.  Continue the overlapping circles, moving downward over the breast until you feel your ribs below your breast.  Move one finger-width toward the center of the body.  Continue to use the  inch (2 cm) overlapping circles to feel your breast as you move slowly up toward your collarbone.  Continue the up and down exam using all three pressures until you reach your armpit.  Write Down What You Find  Write down what is normal for each breast and any changes that you find. Keep a written record with breast changes or normal findings for each breast. By writing this information down, you do not need to depend only on memory for size, tenderness, or location. Write down where you are in your menstrual cycle, if you are still menstruating. If you are having trouble noticing differences in your breasts, do not get discouraged. With time you will become more familiar with the variations in your breasts and more comfortable with the exam. How often should I examine my breasts? Examine   your breasts every month. If you are breastfeeding, the best time to examine your breasts is after a feeding or after using a breast pump. If you menstruate, the best time to examine your breasts is 5-7 days after your period is over. During your period, your breasts are lumpier, and it may be more difficult to notice changes. When should I see my health care provider? See your health care provider if you notice:  A change in shape or size of your breasts or nipples.  A change in the skin of your breast or nipples, such as a reddened or scaly area.  Unusual discharge from your nipples.  A lump or thick area that was not there before.  Pain in your breasts.  Anything that concerns you.  This information is not intended to replace advice given to you by your health care provider. Make sure you discuss any questions you have with your health care provider. Document Released: 01/03/2005 Document Revised: 06/11/2015 Document Reviewed: 11/23/2014 Elsevier Interactive Patient Education  2018 [employer]  

## 2017-07-20 LAB — COMPREHENSIVE METABOLIC PANEL
ALBUMIN: 4.6 g/dL (ref 3.5–5.5)
ALK PHOS: 79 IU/L (ref 39–117)
ALT: 17 IU/L (ref 0–32)
AST: 20 IU/L (ref 0–40)
Albumin/Globulin Ratio: 1.4 (ref 1.2–2.2)
BILIRUBIN TOTAL: 0.8 mg/dL (ref 0.0–1.2)
BUN / CREAT RATIO: 15 (ref 9–23)
BUN: 11 mg/dL (ref 6–20)
CHLORIDE: 102 mmol/L (ref 96–106)
CO2: 26 mmol/L (ref 20–29)
CREATININE: 0.75 mg/dL (ref 0.57–1.00)
Calcium: 9.4 mg/dL (ref 8.7–10.2)
GFR calc Af Amer: 131 mL/min/{1.73_m2} (ref >59)
GFR calc non Af Amer: 114 mL/min/{1.73_m2} (ref >59)
GLOBULIN, TOTAL: 3.4 g/dL (ref 1.5–4.5)
GLUCOSE: 89 mg/dL (ref 65–99)
Potassium: 3.8 mmol/L (ref 3.5–5.2)
Sodium: 137 mmol/L (ref 134–144)
TOTAL PROTEIN: 8 g/dL (ref 6.0–8.5)

## 2017-07-20 LAB — CBC
HEMATOCRIT: 38.6 % (ref 34.0–46.6)
HEMOGLOBIN: 12.6 g/dL (ref 11.1–15.9)
MCH: 30.3 pg (ref 26.6–33.0)
MCHC: 32.6 g/dL (ref 31.5–35.7)
MCV: 93 fL (ref 79–97)
Platelets: 214 10*3/uL (ref 150–450)
RBC: 4.16 x10E6/uL (ref 3.77–5.28)
RDW: 12.7 % (ref 12.3–15.4)
WBC: 4.1 10*3/uL (ref 3.4–10.8)

## 2017-07-20 LAB — TSH: TSH: 1.52 u[IU]/mL (ref 0.450–4.500)

## 2017-08-22 NOTE — Progress Notes (Signed)
GYNECOLOGY  VISIT   HPI: 22 y.o.   Single  African American  female   G0P0000 with Patient's last menstrual period was 08/07/2017 (approximate).   here to discuss birth control options.  She was previously on OCP's for dysmenorrhea, but stopped secondary to mild depression which improved off of OCP's. The depression was with the Yaz, previously was on loestrin and didn't have the mood changes. Her cramps are getting worse again and not helped enough with the anaprox. Never sexually active.   GYNECOLOGIC HISTORY: Patient's last menstrual period was 08/07/2017 (approximate). Contraception:None Menopausal hormone therapy: None        OB History    Gravida  0   Para  0   Term  0   Preterm  0   AB  0   Living  0     SAB  0   TAB  0   Ectopic  0   Multiple  0   Live Births                 Patient Active Problem List   Diagnosis Date Noted  . Hip pain 03/18/2015  . Dysmenorrhea 03/18/2015    History reviewed. No pertinent past medical history.  History reviewed. No pertinent surgical history.  Current Outpatient Medications  Medication Sig Dispense Refill  . naproxen sodium (ANAPROX DS) 550 MG tablet 1 tablet po q 12 hours prn 30 tablet 2   No current facility-administered medications for this visit.      ALLERGIES: Pollen extract  Family History  Problem Relation Age of Onset  . Healthy Mother   . Healthy Father   . Hypertension Maternal Grandmother   . Hyperlipidemia Maternal Grandmother   . Diabetes Maternal Grandfather   . Hypertension Paternal Grandmother   . Kidney disease Paternal Grandfather     Social History   Socioeconomic History  . Marital status: Single    Spouse name: Not on file  . Number of children: 0  . Years of education: 27  . Highest education level: Not on file  Occupational History  . Occupation: Magazine features editor  Social Needs  . Financial resource strain: Not on file  . Food insecurity:    Worry: Not on file     Inability: Not on file  . Transportation needs:    Medical: Not on file    Non-medical: Not on file  Tobacco Use  . Smoking status: Never Smoker  . Smokeless tobacco: Never Used  Substance and Sexual Activity  . Alcohol use: No    Alcohol/week: 0.0 oz  . Drug use: No  . Sexual activity: Never    Birth control/protection: None  Lifestyle  . Physical activity:    Days per week: Not on file    Minutes per session: Not on file  . Stress: Not on file  Relationships  . Social connections:    Talks on phone: Not on file    Gets together: Not on file    Attends religious service: Not on file    Active member of club or organization: Not on file    Attends meetings of clubs or organizations: Not on file    Relationship status: Not on file  . Intimate partner violence:    Fear of current or ex partner: Not on file    Emotionally abused: Not on file    Physically abused: Not on file    Forced sexual activity: Not on file  Other Topics Concern  .  Not on file  Social History Narrative   Fun: Write   Denies abuse and feels safe at home.     Review of Systems  Constitutional: Negative.   HENT: Negative.   Eyes: Negative.   Respiratory: Negative.   Cardiovascular: Negative.   Gastrointestinal: Negative.   Genitourinary:       Dysmenorrhea  Musculoskeletal: Negative.   Skin: Negative.   Neurological: Negative.   Endo/Heme/Allergies: Negative.   Psychiatric/Behavioral: Negative.     PHYSICAL EXAMINATION:    BP 110/64 (BP Location: Right Arm, Patient Position: Sitting)   Pulse 88   Wt 112 lb 6.4 oz (51 kg)   LMP 08/07/2017 (Approximate)   BMI 20.23 kg/m     General appearance: alert, cooperative and appears stated age   ASSESSMENT Severe dysmenorrhea, previously helped with OCP's    PLAN Discussed options for contraception that can help with her dysmenorrhea Discussed restarting OCP's, the nuvaring, depo-provera, nexplanon (more likely to cause irregular  bleeding), and the mirena IUD. She would like to restart OCP's (will do loestrin, that one didn't cause depression). Information on the depo-provera and mirena IUD was given and reviewed   An After Visit Summary was printed and given to the patient.  ~15 minutes face to face time of which over 50% was spent in counseling.

## 2017-08-23 ENCOUNTER — Encounter: Payer: Self-pay | Admitting: Obstetrics and Gynecology

## 2017-08-23 ENCOUNTER — Other Ambulatory Visit: Payer: Self-pay

## 2017-08-23 ENCOUNTER — Ambulatory Visit (INDEPENDENT_AMBULATORY_CARE_PROVIDER_SITE_OTHER): Payer: BLUE CROSS/BLUE SHIELD | Admitting: Obstetrics and Gynecology

## 2017-08-23 VITALS — BP 110/64 | HR 88 | Wt 112.4 lb

## 2017-08-23 DIAGNOSIS — N946 Dysmenorrhea, unspecified: Secondary | ICD-10-CM

## 2017-08-23 MED ORDER — NORETHIN ACE-ETH ESTRAD-FE 1-20 MG-MCG PO TABS: 1.0000 | ORAL_TABLET | Freq: Every day | ORAL | 0 refills | Status: DC

## 2017-08-23 NOTE — Patient Instructions (Signed)

## 2017-09-15 ENCOUNTER — Telehealth: Payer: Self-pay | Admitting: Obstetrics and Gynecology

## 2017-09-15 NOTE — Telephone Encounter (Signed)
Patient states her birth control is not working for her and would like to make a change. Confirmed pharmacy on file.

## 2017-09-15 NOTE — Telephone Encounter (Signed)
Patient started on Loestrin 1/20 FE on 08/27/17.  Has been taking daily. Not sexually active. No missed or late pills.  She stopped Yaz secondary to depression on Yaz.  She states since she has started the Loestrin she feels "moody" and "different." Denies SI/HI. She states she does not feel depressed at this time.  Encouraged patient to continue on her current pill and discussed hormonal changes with new start of pill. Advised that changing back to Dianah FieldYaz is not recommended at this time as she has only been on the new pills for 2 weeks.  Advised will send message to Dr. Oscar LaJertson to review at her return. Pt instructed to call back with any further concerns.   Routing to Dr. Oscar LaJertson and covering provider, Dr. Hyacinth MeekerMiller.

## 2017-09-19 NOTE — Telephone Encounter (Signed)
Spoke with patient, advised as seen below per Dr. Oscar La. Patient states she will give OCP some more time. Is aware to return call if any questions or concerns.   Routing to provider for final review. Patient is agreeable to disposition. Will close encounter.

## 2017-09-19 NOTE — Telephone Encounter (Signed)
Message left to return call to Mikayla Robinson at [phone].    

## 2017-09-19 NOTE — Telephone Encounter (Signed)
If her mood changes are tolerable, I would try to hang in there a little longer. She didn't previously have mood changes on this pill. She felt like the Yaz made her depressed, so I wouldn't try that.  She could consider an IUD for help with her dysmenorrhea, although I don't think she wants to head in that direction.

## 2017-09-19 NOTE — Telephone Encounter (Signed)
Patient is returning a call to Tracy. °

## 2017-10-18 ENCOUNTER — Telehealth: Payer: Self-pay | Admitting: Obstetrics and Gynecology

## 2017-10-18 NOTE — Telephone Encounter (Signed)
Spoke with patient. She states 3 days ago she began to have a headache with associated dizziness and  nasal congestion. Taking aleve for headache. Helps some, but still having some pain. She states she has been feeling cold as well. States "I think my iron level is really low."  LMP 10/12/17. Not heavy or abnormal per patient. Taking Junel FE.  Patient states she also has a problem with her R ear but this is usual for her.  No fevers.  Advised patient that she should see her PCP or urgent care for evaluation of Ear/Nose/Throat. That any blockage of Ear/Nasal area can cause dizziness and Headache and needs to have sinus infection ruled out. Encouraged evaluation by PCP or Urgent Care.  Pt requests to see Dr. Oscar La to discuss cycles/iron levels. Not currently on cycle.  Appointment scheduled for tomorrow at 1600. Advised if sees PCP or Urgent care and decides she would like to wait on discusses cycles she can do so, just call back and can keep appointment for follow up in November that she has scheduled. Patient agreeable to plan. Emergency symptoms discussed as well.  Routing to Dr. Oscar La and will close encounter.

## 2017-10-18 NOTE — Telephone Encounter (Signed)
Patient called stating that she has been experiencing dizziness. Patient stated that it started a few days ago. She stated she is also "always cold with a runny nose."

## 2017-10-19 ENCOUNTER — Ambulatory Visit: Payer: BLUE CROSS/BLUE SHIELD | Admitting: Obstetrics and Gynecology

## 2017-10-19 ENCOUNTER — Encounter: Payer: Self-pay | Admitting: Obstetrics and Gynecology

## 2017-10-19 ENCOUNTER — Other Ambulatory Visit: Payer: Self-pay

## 2017-10-19 VITALS — BP 110/62 | HR 84 | Wt 115.8 lb

## 2017-10-19 DIAGNOSIS — R42 Dizziness and giddiness: Secondary | ICD-10-CM | POA: Diagnosis not present

## 2017-10-19 MED ORDER — MECLIZINE HCL 12.5 MG PO TABS: 12.5000 mg | ORAL_TABLET | Freq: Three times a day (TID) | ORAL | 0 refills | Status: AC | PRN

## 2017-10-19 NOTE — Progress Notes (Signed)
GYNECOLOGY  VISIT   HPI: 22 y.o.   Single Black or African American Not Hispanic or Latino  female   G0P0000 with Patient's last menstrual period was 10/07/2017 (exact date).   here for fatigue, dizziness, headaches started 1 week ago. No vision changes. She was sitting when the dizziness started. The room spins. Worse with change of position.  Denies any heavy bleeding. She is on OCP's, cycles are light and monthly on the pill.   GYNECOLOGIC HISTORY: Patient's last menstrual period was 10/07/2017 (exact date). Contraception: OCP Menopausal hormone therapy: None        OB History    Gravida  0   Para  0   Term  0   Preterm  0   AB  0   Living  0     SAB  0   TAB  0   Ectopic  0   Multiple  0   Live Births                 Patient Active Problem List   Diagnosis Date Noted  . Hip pain 03/18/2015  . Dysmenorrhea 03/18/2015    History reviewed. No pertinent past medical history.  History reviewed. No pertinent surgical history.  Current Outpatient Medications  Medication Sig Dispense Refill  . naproxen sodium (ANAPROX DS) 550 MG tablet 1 tablet po q 12 hours prn 30 tablet 2  . norethindrone-ethinyl estradiol (JUNEL FE,GILDESS FE,LOESTRIN FE) 1-20 MG-MCG tablet Take 1 tablet by mouth daily. 3 Package 0   No current facility-administered medications for this visit.      ALLERGIES: Pollen extract  Family History  Problem Relation Age of Onset  . Healthy Mother   . Healthy Father   . Hypertension Maternal Grandmother   . Hyperlipidemia Maternal Grandmother   . Diabetes Maternal Grandfather   . Hypertension Paternal Grandmother   . Kidney disease Paternal Grandfather     Social History   Socioeconomic History  . Marital status: Single    Spouse name: Not on file  . Number of children: 0  . Years of education: 52  . Highest education level: Not on file  Occupational History  . Occupation: Magazine features editor  Social Needs  . Financial  resource strain: Not on file  . Food insecurity:    Worry: Not on file    Inability: Not on file  . Transportation needs:    Medical: Not on file    Non-medical: Not on file  Tobacco Use  . Smoking status: Never Smoker  . Smokeless tobacco: Never Used  Substance and Sexual Activity  . Alcohol use: No    Alcohol/week: 0.0 standard drinks  . Drug use: No  . Sexual activity: Never    Birth control/protection: Pill  Lifestyle  . Physical activity:    Days per week: Not on file    Minutes per session: Not on file  . Stress: Not on file  Relationships  . Social connections:    Talks on phone: Not on file    Gets together: Not on file    Attends religious service: Not on file    Active member of club or organization: Not on file    Attends meetings of clubs or organizations: Not on file    Relationship status: Not on file  . Intimate partner violence:    Fear of current or ex partner: Not on file    Emotionally abused: Not on file    Physically abused: Not  on file    Forced sexual activity: Not on file  Other Topics Concern  . Not on file  Social History Narrative   Fun: Write   Denies abuse and feels safe at home.     Review of Systems  Constitutional: Negative.   HENT: Negative.   Eyes: Negative.   Respiratory: Negative.   Cardiovascular: Negative.   Gastrointestinal: Negative.   Endocrine: Negative.   Genitourinary: Negative.   Musculoskeletal: Negative.   Skin: Negative.   Allergic/Immunologic: Negative.   Neurological: Positive for dizziness and headaches.  Hematological: Negative.   Psychiatric/Behavioral: Negative.     PHYSICAL EXAMINATION:    BP 110/62 (BP Location: Right Arm, Patient Position: Sitting, Cuff Size: Normal)   Pulse 84   Wt 115 lb 12.8 oz (52.5 kg)   LMP 10/07/2017 (Exact Date)   BMI 20.84 kg/m     General appearance: alert, cooperative and appears stated age  ASSESSMENT Vertigo    PLAN Will start meclizine 12.5 mg TID prn She  needs to see her primary for proper evaluation and any further treatment   An After Visit Summary was printed and given to the patient.

## 2017-10-19 NOTE — Patient Instructions (Addendum)
Please follow up with Jeanine Luz, FNP  Vertigo Vertigo means that you feel like you are moving when you are not. Vertigo can also make you feel like things around you are moving when they are not. This feeling can come and go at any time. Vertigo often goes away on its own. Follow these instructions at home:  Avoid making fast movements.  Avoid driving.  Avoid using heavy machinery.  Avoid doing any task or activity that might cause danger to you or other people if you would have a vertigo attack while you are doing it.  Sit down right away if you feel dizzy or have trouble with your balance.  Take over-the-counter and prescription medicines only as told by your doctor.  Follow instructions from your doctor about which positions or movements you should avoid.  Drink enough fluid to keep your pee (urine) clear or pale yellow.  Keep all follow-up visits as told by your doctor. This is important. Contact a doctor if:  Medicine does not help your vertigo.  You have a fever.  Your problems get worse or you have new symptoms.  Your family or friends see changes in your behavior.  You feel sick to your stomach (nauseous) or you throw up (vomit).  You have a "pins and needles" feeling or you are numb in part of your body. Get help right away if:  You have trouble moving or talking.  You are always dizzy.  You pass out (faint).  You get very bad headaches.  You feel weak or have trouble using your hands, arms, or legs.  You have changes in your hearing.  You have changes in your seeing (vision).  You get a stiff neck.  Bright light starts to bother you. This information is not intended to replace advice given to you by your health care provider. Make sure you discuss any questions you have with your health care provider. Document Released: 10/13/2007 Document Revised: 06/11/2015 Document Reviewed: 04/28/2014 Elsevier Interactive Patient Education  [employer].

## 2017-11-07 ENCOUNTER — Other Ambulatory Visit: Payer: Self-pay | Admitting: Obstetrics and Gynecology

## 2017-11-07 NOTE — Telephone Encounter (Signed)
Spoke with patient. Patient previously schuled for OV on 11/23/17 at 3pm. Instructed patient to keep OV as scheduled for f/u and future refills of OCP. Patient verbalizes understanding and is agreeable. Encounter closed.

## 2017-11-07 NOTE — Telephone Encounter (Signed)
She should come in for follow up. One month pack sent. Please set her up for an appointment.

## 2017-11-07 NOTE — Telephone Encounter (Signed)
Medication refill request: Junel Fe  Last AEX: 07/19/17 Next AEX: 07/25/18 Last MMG (if hormonal medication request): NA Refill authorized:  #[id number] with 0 RF

## 2017-11-21 NOTE — Progress Notes (Signed)
GYNECOLOGY  VISIT   HPI: 22 y.o.   Single Black or African American Not Hispanic or Latino  female   G0P0000 with No LMP recorded.   here for 3 month follow up on OCP. The patient was restarted on OCP's 3 months ago for control of severe dysmenorrhea. She previously had depression on OCP's.  Never sexually active.  She has had some issues with taking the pill at the same time daily. When she works she is up at 3:45 in the am and take her pill at 4 am. When she isn't working she is taking the pill at 8 am.  Cycles coming monthly x 4-5 days. Saturates a pad in 4 hours. Cramps are still bad for one day, but better than when she was off the pill. The anaprox is helping now, wasn't helping off the pill. She struggles some with depression. Feels depressed off and on, not daily, no thoughts of hurting herself or others.  She c/o worsening acne on this pill compared to the Yaz. Wants to try the Yaz again. Isn't so sure the Yaz caused her depression, thinks it was situational.   She is taking 4 on line classes this semester and working full time. She is supposed to graduate in 5/20  GYNECOLOGIC HISTORY: No LMP recorded. Patient is unsure of date. Was in October. Contraception:OCP Menopausal hormone therapy: None        OB History    Gravida  0   Para  0   Term  0   Preterm  0   AB  0   Living  0     SAB  0   TAB  0   Ectopic  0   Multiple  0   Live Births                 Patient Active Problem List   Diagnosis Date Noted  . Hip pain 03/18/2015  . Dysmenorrhea 03/18/2015    History reviewed. No pertinent past medical history.  History reviewed. No pertinent surgical history.  Current Outpatient Medications  Medication Sig Dispense Refill  . JUNEL FE 1/20 1-20 MG-MCG tablet TAKE 1 TABLET BY MOUTH EVERY DAY 28 tablet 0  . meclizine (ANTIVERT) 12.5 MG tablet Take 1 tablet (12.5 mg total) by mouth 3 (three) times daily as needed for dizziness. 30 tablet 0  . naproxen  sodium (ANAPROX DS) 550 MG tablet 1 tablet po q 12 hours prn 30 tablet 2   No current facility-administered medications for this visit.      ALLERGIES: Pollen extract  Family History  Problem Relation Age of Onset  . Healthy Mother   . Healthy Father   . Hypertension Maternal Grandmother   . Hyperlipidemia Maternal Grandmother   . Diabetes Maternal Grandfather   . Hypertension Paternal Grandmother   . Kidney disease Paternal Grandfather     Social History   Socioeconomic History  . Marital status: Single    Spouse name: Not on file  . Number of children: 0  . Years of education: 72  . Highest education level: Not on file  Occupational History  . Occupation: Magazine features editor  Social Needs  . Financial resource strain: Not on file  . Food insecurity:    Worry: Not on file    Inability: Not on file  . Transportation needs:    Medical: Not on file    Non-medical: Not on file  Tobacco Use  . Smoking status: Never Smoker  .  Smokeless tobacco: Never Used  Substance and Sexual Activity  . Alcohol use: No    Alcohol/week: 0.0 standard drinks  . Drug use: No  . Sexual activity: Never    Birth control/protection: Pill  Lifestyle  . Physical activity:    Days per week: Not on file    Minutes per session: Not on file  . Stress: Not on file  Relationships  . Social connections:    Talks on phone: Not on file    Gets together: Not on file    Attends religious service: Not on file    Active member of club or organization: Not on file    Attends meetings of clubs or organizations: Not on file    Relationship status: Not on file  . Intimate partner violence:    Fear of current or ex partner: Not on file    Emotionally abused: Not on file    Physically abused: Not on file    Forced sexual activity: Not on file  Other Topics Concern  . Not on file  Social History Narrative   Fun: Write   Denies abuse and feels safe at home.     Review of Systems   Constitutional: Negative.   HENT: Positive for hearing loss.   Eyes: Negative.   Respiratory: Negative.   Cardiovascular: Negative.   Gastrointestinal: Negative.   Genitourinary: Negative.   Musculoskeletal: Negative.   Skin: Negative.   Neurological: Negative.   Endo/Heme/Allergies: Negative.   Psychiatric/Behavioral: Negative.     PHYSICAL EXAMINATION:    BP 102/60 (BP Location: Right Arm, Patient Position: Sitting, Cuff Size: Normal)   Pulse 80   Wt 116 lb (52.6 kg)   BMI 20.88 kg/m     General appearance: alert, cooperative and appears stated age Skin: no significant acne seen, the patient states that it comes and goes.   ASSESSMENT Dysmenorrhea, improved on OCP's, tolerable again Depression is stable and intermittent Acne has worsened    PLAN Wants to retry the Yaz F/U in 3 months Call with any concerns.   An After Visit Summary was printed and given to the patient.

## 2017-11-23 ENCOUNTER — Encounter: Payer: Self-pay | Admitting: Obstetrics and Gynecology

## 2017-11-23 ENCOUNTER — Ambulatory Visit: Payer: BLUE CROSS/BLUE SHIELD | Admitting: Obstetrics and Gynecology

## 2017-11-23 ENCOUNTER — Other Ambulatory Visit: Payer: Self-pay

## 2017-11-23 VITALS — BP 102/60 | HR 80 | Wt 116.0 lb

## 2017-11-23 DIAGNOSIS — L709 Acne, unspecified: Secondary | ICD-10-CM | POA: Diagnosis not present

## 2017-11-23 DIAGNOSIS — Z8659 Personal history of other mental and behavioral disorders: Secondary | ICD-10-CM | POA: Diagnosis not present

## 2017-11-23 DIAGNOSIS — N946 Dysmenorrhea, unspecified: Secondary | ICD-10-CM | POA: Diagnosis not present

## 2017-11-23 MED ORDER — DROSPIRENONE-ETHINYL ESTRADIOL 3-0.02 MG PO TABS: 1.0000 | ORAL_TABLET | Freq: Every day | ORAL | 0 refills | Status: DC

## 2017-11-30 ENCOUNTER — Telehealth: Payer: Self-pay | Admitting: Obstetrics and Gynecology

## 2017-11-30 NOTE — Telephone Encounter (Signed)
Patient started taking birth control Sunday 11/26/17 and now has red bumps on her skin that itch.

## 2017-11-30 NOTE — Telephone Encounter (Signed)
Spoke with patient. Patient started OCP YAZ on 11/26/17. Patient reports small red, raised, itchy spots on her left arm up to her left shoulder and a few on her right arm. Skin is closed, no drainage. Noticed spots after starting OCP. Stopped OCP on 11/13. Symptoms are unchanged.  Applied rubbing alcohol and "itch cream", provided some relief.   Patient denies any other changes in soaps, detergents or bedding. Denies wheezing or SOB.   Advised patient to discontinue OCP, allow for symptoms to resolve, return call to office once resolved to discuss alternative OCP. Use BUM for contraceptive. If symptoms do not resolve, need to f/u with PCP for further evaluation.  Dr. Oscar LaJertson will review, our office will return call if any additional recommendations.  Routing to provider for final review. Patient is agreeable to disposition. Will close encounter.

## 2017-11-30 NOTE — Telephone Encounter (Signed)
Please have the patient f/u with her primary now. I'm not sure that this is a drug rash. It is not a common issue to be allergic to OCP's. I would hate for her not to be able to use this pill if that isn't the cause of her rash.

## 2017-12-01 NOTE — Telephone Encounter (Signed)
Spoke with patient, advised as seen below per Dr. Oscar LaJertson. Patient verbalizes understanding, will call with update once evaluated.   Routing to provider for final review. Patient is agreeable to disposition. Will close encounter.

## 2017-12-01 NOTE — Telephone Encounter (Signed)
Left message to call Aniyiah Zell, RN at [facility] [phone].   

## 2017-12-08 ENCOUNTER — Ambulatory Visit: Payer: BLUE CROSS/BLUE SHIELD | Admitting: Internal Medicine

## 2017-12-08 ENCOUNTER — Encounter: Payer: Self-pay | Admitting: Internal Medicine

## 2017-12-08 DIAGNOSIS — R21 Rash and other nonspecific skin eruption: Secondary | ICD-10-CM

## 2017-12-08 MED ORDER — TRIAMCINOLONE ACETONIDE 0.1 % EX CREA: 1.0000 "application " | TOPICAL_CREAM | Freq: Two times a day (BID) | CUTANEOUS | 0 refills | Status: DC

## 2017-12-08 NOTE — Progress Notes (Signed)
   Subjective:    Patient ID: Mikayla Robinson, female    DOB: 1996-01-09, 22 y.o.   MRN: [id number]  HPI The patient is a 22 YO female coming in for rash on her chest, arms. Started about 3 days after started new birth control medicine. She was taking yaz awhile ago and stopped for side effects. She then got back on this birth control. She is trying not to scratch. She has used alcohol on them and some of them have stopped itching. She called her ob/gyn and they told her to stop the medicine and she did about 5 days ago. She is not sure if she is noticing any improvement. She denies change in lotion, soap, perfume.   Review of Systems  Constitutional: Negative.   Respiratory: Negative for cough, chest tightness and shortness of breath.   Cardiovascular: Negative for chest pain, palpitations and leg swelling.  Gastrointestinal: Negative for abdominal distention, abdominal pain, constipation, diarrhea, nausea and vomiting.  Musculoskeletal: Negative.   Skin: Positive for rash.  Neurological: Negative.   Psychiatric/Behavioral: Negative.       Objective:   Physical Exam  Constitutional: She is oriented to person, place, and time. She appears well-developed and well-nourished.  HENT:  Head: Normocephalic and atraumatic.  Eyes: EOM are normal.  Neck: Normal range of motion.  Cardiovascular: Normal rate and regular rhythm.  Pulmonary/Chest: Effort normal and breath sounds normal. No respiratory distress. She has no wheezes. She has no rales.  Abdominal: Soft. Bowel sounds are normal. She exhibits no distension. There is no tenderness. There is no rebound.  Musculoskeletal: She exhibits no edema.  Neurological: She is alert and oriented to person, place, and time. Coordination normal.  Skin: Skin is warm and dry. Rash noted.  Small red fine rash on the chest and right arm   Vitals:   12/08/17 1532  BP: 112/80  Pulse: 77  Temp: 98.8 F (37.1 C)  TempSrc: Oral  SpO2: 99%  Weight:  117 lb (53.1 kg)  Height: 5' 2.5" (1.588 m)      Assessment & Plan:

## 2017-12-08 NOTE — Patient Instructions (Signed)
We have sent in a cream to use on the spots which are itchy. Use this twice a day until gone.

## 2017-12-08 NOTE — Assessment & Plan Note (Signed)
Rx for triamcinolone and advised that she needs alternative form of birth control as she is now off birth control and contact ob/gyn for another option.

## 2017-12-11 ENCOUNTER — Telehealth: Payer: Self-pay | Admitting: Obstetrics and Gynecology

## 2017-12-11 NOTE — Telephone Encounter (Signed)
Spoke with patient. Seen by PCP on 12/08/17 for rash after starting OCP Yaz. Patient states PCP advised rash likely from OCP, recommended patient change to another form of contraceptive.   Patient states she has discussed some alternative contraceptives in the past such as depo provera and Mirena.   OV scheduled for contraceptive consult 11/26 at 4:15pm with Dr. Oscar LaJertson.   Routing to provider for final review. Patient is agreeable to disposition. Will close encounter.

## 2017-12-11 NOTE — Telephone Encounter (Signed)
Patient called to give update on rash after being seen by PCP. Call transferred to Carmelina DaneJill Hamm, RN.

## 2017-12-12 ENCOUNTER — Other Ambulatory Visit: Payer: Self-pay

## 2017-12-12 ENCOUNTER — Ambulatory Visit: Payer: BLUE CROSS/BLUE SHIELD | Admitting: Obstetrics and Gynecology

## 2017-12-12 ENCOUNTER — Encounter: Payer: Self-pay | Admitting: Obstetrics and Gynecology

## 2017-12-12 VITALS — BP 102/64 | HR 82 | Wt 115.4 lb

## 2017-12-12 DIAGNOSIS — T7840XD Allergy, unspecified, subsequent encounter: Secondary | ICD-10-CM

## 2017-12-12 DIAGNOSIS — R21 Rash and other nonspecific skin eruption: Secondary | ICD-10-CM | POA: Diagnosis not present

## 2017-12-12 DIAGNOSIS — N946 Dysmenorrhea, unspecified: Secondary | ICD-10-CM

## 2017-12-12 NOTE — Progress Notes (Signed)
GYNECOLOGY  VISIT  HPI: 22 y.o.   Single Black or African American Not Hispanic or Latino  female   G0P0000 with Patient's last menstrual period was 12/03/2017.   here for consult regarding birth control. Wants to discuss other options other than OCP. She has severe dysmenorrhea that is helped on OCP's, but she has issues with taking at the same time daily. She was changed to Yaz recently, but developed a rash within a few days of starting the Yaz. She previously had been on the Yaz without a problem. Primary thought the rash could be from the new pill and had her stop it and treated her with a steroid cream. She went off the pill well over a week ago and is still developing new bumps that are itchy. Never sexually active. She isn't comfortable with tampons.   GYNECOLOGIC HISTORY: Patient's last menstrual period was 12/03/2017. Contraception:None Menopausal hormone therapy: None        OB History    Gravida  0   Para  0   Term  0   Preterm  0   AB  0   Living  0     SAB  0   TAB  0   Ectopic  0   Multiple  0   Live Births                 Patient Active Problem List   Diagnosis Date Noted  . Rash 12/08/2017  . Hip pain 03/18/2015  . Dysmenorrhea 03/18/2015    History reviewed. No pertinent past medical history.  History reviewed. No pertinent surgical history.  Current Outpatient Medications  Medication Sig Dispense Refill  . meclizine (ANTIVERT) 12.5 MG tablet Take 1 tablet (12.5 mg total) by mouth 3 (three) times daily as needed for dizziness. 30 tablet 0  . naproxen sodium (ANAPROX DS) 550 MG tablet 1 tablet po q 12 hours prn 30 tablet 2  . triamcinolone cream (KENALOG) 0.1 % Apply 1 application topically 2 (two) times daily. 100 g 0   No current facility-administered medications for this visit.      ALLERGIES: Pollen extract  Family History  Problem Relation Age of Onset  . Healthy Mother   . Healthy Father   . Hypertension Maternal Grandmother    . Hyperlipidemia Maternal Grandmother   . Diabetes Maternal Grandfather   . Hypertension Paternal Grandmother   . Kidney disease Paternal Grandfather     Social History   Socioeconomic History  . Marital status: Single    Spouse name: Not on file  . Number of children: 0  . Years of education: 67  . Highest education level: Not on file  Occupational History  . Occupation: Magazine features editor  Social Needs  . Financial resource strain: Not on file  . Food insecurity:    Worry: Not on file    Inability: Not on file  . Transportation needs:    Medical: Not on file    Non-medical: Not on file  Tobacco Use  . Smoking status: Never Smoker  . Smokeless tobacco: Never Used  Substance and Sexual Activity  . Alcohol use: No    Alcohol/week: 0.0 standard drinks  . Drug use: No  . Sexual activity: Never    Birth control/protection: None  Lifestyle  . Physical activity:    Days per week: Not on file    Minutes per session: Not on file  . Stress: Not on file  Relationships  . Social connections:  Talks on phone: Not on file    Gets together: Not on file    Attends religious service: Not on file    Active member of club or organization: Not on file    Attends meetings of clubs or organizations: Not on file    Relationship status: Not on file  . Intimate partner violence:    Fear of current or ex partner: Not on file    Emotionally abused: Not on file    Physically abused: Not on file    Forced sexual activity: Not on file  Other Topics Concern  . Not on file  Social History Narrative   Fun: Write   Denies abuse and feels safe at home.     Review of Systems  Constitutional: Negative.   HENT: Negative.   Eyes: Negative.   Respiratory: Negative.   Cardiovascular: Negative.   Gastrointestinal: Negative.   Genitourinary: Negative.   Musculoskeletal: Negative.   Skin: Negative.   Neurological: Negative.   Endo/Heme/Allergies: Negative.   Psychiatric/Behavioral:  Negative.     PHYSICAL EXAMINATION:    BP 102/64 (BP Location: Right Arm, Patient Position: Sitting, Cuff Size: Normal)   Pulse 82   Wt 115 lb 6.4 oz (52.3 kg)   LMP 12/03/2017   BMI 20.77 kg/m     General appearance: alert, cooperative and appears stated age Skin: she has a papular, erythematous rash on her right lower arm.   ASSESSMENT Severe dysmenorrhea, helped on OCP's but developed a rash on Yaz (restarted it after being on another pill) Not sexually active Rash after starting the Yaz, but persisting 1+ weeks after going off of it.     PLAN Discussed the nuvaring and depo-provera as potential options to manage her dysmenorrhea. Not interested in the nuvaring Discussed side effects of the depo-provera, reading information given Will refer to an Allergist for her rash.  She will call on her cycle to start Depo-provera if she still wants to take it after reading through the information She has NSAID's for her cramps, don't help much   An After Visit Summary was printed and given to the patient.  Over 15 minutes face to face time of which over 50% was spent in counseling.

## 2017-12-12 NOTE — Patient Instructions (Signed)
Endometriosis Endometriosis is a condition in which the tissue that lines the uterus (endometrium) grows outside of its normal location. The tissue may grow in many locations close to the uterus, but it commonly grows on the ovaries, fallopian tubes, vagina, or bowel. When the uterus sheds the endometrium every menstrual cycle, there is bleeding wherever the endometrial tissue is located. This can cause pain because blood is irritating to tissues that are not normally exposed to it. What are the causes? The cause of endometriosis is not known. What increases the risk? You may be more likely to develop endometriosis if you:  Have a family history of endometriosis.  Have never given birth.  Started your period at age 10 or younger.  Have high levels of estrogen in your body.  Were exposed to a certain medicine (diethylstilbestrol) before you were born (in utero).  Had low birth weight.  Were born as a twin, triplet, or other multiple.  Have a BMI of less than 25. BMI is an estimate of body fat and is calculated from height and weight.  What are the signs or symptoms? Often, there are no symptoms of this condition. If you do have symptoms, they may:  Vary depending on where your endometrial tissue is growing.  Occur during your menstrual period (most common) or midcycle.  Come and go, or you may go months with no symptoms at all.  Stop with menopause.  Symptoms may include:  Pain in the back or abdomen.  Heavier bleeding during periods.  Pain during sex.  Painful bowel movements.  Infertility.  Pelvic pain.  Bleeding more than once a month.  How is this diagnosed? This condition is diagnosed based on your symptoms and a physical exam. You may have tests, such as:  Blood tests and urine tests. These may be done to help rule out other possible causes of your symptoms.  Ultrasound, to look for abnormal tissues.  An X-ray of the lower bowel (barium enema).  An  ultrasound that is done through the vagina (transvaginally).  CT scan.  MRI.  Laparoscopy. In this procedure, a lighted, pencil-sized instrument called a laparoscope is inserted into your abdomen through an incision. The laparoscope allows your health care provider to look at the organs inside your body and check for abnormal tissue to confirm the diagnosis. If abnormal tissue is found, your health care provider may remove a small piece of tissue (biopsy) to be examined under a microscope.  How is this treated? Treatment for this condition may include:  Medicines to relieve pain, such as NSAIDs.  Hormone therapy. This involves using artificial (synthetic) hormones to reduce endometrial tissue growth. Your health care provider may recommend using a hormonal form of birth control, or other medicines.  Surgery. This may be done to remove abnormal endometrial tissue. ? In some cases, tissue may be removed using a laparoscope and a laser (laparoscopic laser treatment). ? In severe cases, surgery may be done to remove the fallopian tubes, uterus, and ovaries (hysterectomy).  Follow these instructions at home:  Take over-the-counter and prescription medicines only as told by your health care provider.  Do not drive or use heavy machinery while taking prescription pain medicine.  Try to avoid activities that cause pain, including sexual activity.  Keep all follow-up visits as told by your health care provider. This is important. Contact a health care provider if:  You have pain in the area between your hip bones (pelvic area) that occurs: ? Before, during, or   after your period. ? In between your period and gets worse during your period. ? During or after sex. ? With bowel movements or urination, especially during your period.  You have problems getting pregnant.  You have a fever. Get help right away if:  You have severe pain that does not get better with medicine.  You have severe  nausea and vomiting, or you cannot eat without vomiting.  You have pain that affects only the lower, right side of your abdomen.  You have abdominal pain that gets worse.  You have abdominal swelling.  You have blood in your stool. This information is not intended to replace advice given to you by your health care provider. Make sure you discuss any questions you have with your health care provider. Document Released: 01/01/2000 Document Revised: 10/09/2015 Document Reviewed: 06/06/2015 Elsevier Interactive Patient Education  2018 [employer] Medroxyprogesterone injection [Contraceptive] What is this medicine? MEDROXYPROGESTERONE (me DROX ee proe JES te rone) contraceptive injections prevent pregnancy. They provide effective birth control for 3 months. Depo-subQ Provera 104 is also used for treating pain related to endometriosis. This medicine may be used for other purposes; ask your health care provider or pharmacist if you have questions. COMMON BRAND NAME(S): Depo-Provera, Depo-subQ Provera 104 What should I tell my health care provider before I take this medicine? They need to know if you have any of these conditions: -frequently drink alcohol -asthma -blood vessel disease or a history of a blood clot in the lungs or legs -bone disease such as osteoporosis -breast cancer -diabetes -eating disorder (anorexia nervosa or bulimia) -high blood pressure -HIV infection or AIDS -kidney disease -liver disease -mental depression -migraine -seizures (convulsions) -stroke -tobacco smoker -vaginal bleeding -an unusual or allergic reaction to medroxyprogesterone, other hormones, medicines, foods, dyes, or preservatives -pregnant or trying to get pregnant -breast-feeding How should I use this medicine? Depo-Provera Contraceptive injection is given into a muscle. Depo-subQ Provera 104 injection is given under the skin. These injections are given by a health care professional. You must  not be pregnant before getting an injection. The injection is usually given during the first 5 days after the start of a menstrual period or 6 weeks after delivery of a baby. Talk to your pediatrician regarding the use of this medicine in children. Special care may be needed. These injections have been used in female children who have started having menstrual periods. Overdosage: If you think you have taken too much of this medicine contact a poison control center or emergency room at once. NOTE: This medicine is only for you. Do not share this medicine with others. What if I miss a dose? Try not to miss a dose. You must get an injection once every 3 months to maintain birth control. If you cannot keep an appointment, call and reschedule it. If you wait longer than 13 weeks between Depo-Provera contraceptive injections or longer than 14 weeks between Depo-subQ Provera 104 injections, you could get pregnant. Use another method for birth control if you miss your appointment. You may also need a pregnancy test before receiving another injection. What may interact with this medicine? Do not take this medicine with any of the following medications: -bosentan This medicine may also interact with the following medications: -aminoglutethimide -antibiotics or medicines for infections, especially rifampin, rifabutin, rifapentine, and griseofulvin -aprepitant -barbiturate medicines such as phenobarbital or primidone -bexarotene -carbamazepine -medicines for seizures like ethotoin, felbamate, oxcarbazepine, phenytoin, topiramate -modafinil -St. John's wort This list may not describe all possible  interactions. Give your health care provider a list of all the medicines, herbs, non-prescription drugs, or dietary supplements you use. Also tell them if you smoke, drink alcohol, or use illegal drugs. Some items may interact with your medicine. What should I watch for while using this medicine? This drug does not  protect you against HIV infection (AIDS) or other sexually transmitted diseases. Use of this product may cause you to lose calcium from your bones. Loss of calcium may cause weak bones (osteoporosis). Only use this product for more than 2 years if other forms of birth control are not right for you. The longer you use this product for birth control the more likely you will be at risk for weak bones. Ask your health care professional how you can keep strong bones. You may have a change in bleeding pattern or irregular periods. Many females stop having periods while taking this drug. If you have received your injections on time, your chance of being pregnant is very low. If you think you may be pregnant, see your health care professional as soon as possible. Tell your health care professional if you want to get pregnant within the next year. The effect of this medicine may last a long time after you get your last injection. What side effects may I notice from receiving this medicine? Side effects that you should report to your doctor or health care professional as soon as possible: -allergic reactions like skin rash, itching or hives, swelling of the face, lips, or tongue -breast tenderness or discharge -breathing problems -changes in vision -depression -feeling faint or lightheaded, falls -fever -pain in the abdomen, chest, groin, or leg -problems with balance, talking, walking -unusually weak or tired -yellowing of the eyes or skin Side effects that usually do not require medical attention (report to your doctor or health care professional if they continue or are bothersome): -acne -fluid retention and swelling -headache -irregular periods, spotting, or absent periods -temporary pain, itching, or skin reaction at site where injected -weight gain This list may not describe all possible side effects. Call your doctor for medical advice about side effects. You may report side effects to FDA at  1-800-FDA-1088. Where should I keep my medicine? This does not apply. The injection will be given to you by a health care professional. NOTE: This sheet is a summary. It may not cover all possible information. If you have questions about this medicine, talk to your doctor, pharmacist, or health care provider.  2018 Elsevier/Gold Standard (2008-01-25 18:37:56)

## 2018-01-01 ENCOUNTER — Ambulatory Visit (INDEPENDENT_AMBULATORY_CARE_PROVIDER_SITE_OTHER): Payer: BLUE CROSS/BLUE SHIELD

## 2018-01-01 ENCOUNTER — Telehealth: Payer: Self-pay | Admitting: Obstetrics and Gynecology

## 2018-01-01 VITALS — BP 100/63 | HR 66 | Resp 16 | Ht 62.5 in | Wt 118.2 lb

## 2018-01-01 DIAGNOSIS — Z3042 Encounter for surveillance of injectable contraceptive: Secondary | ICD-10-CM

## 2018-01-01 MED ORDER — MEDROXYPROGESTERONE ACETATE 150 MG/ML IM SUSP
150.0000 mg | Freq: Once | INTRAMUSCULAR | Status: AC
  Administered 2018-01-01: 150 mg via INTRAMUSCULAR

## 2018-01-01 NOTE — Telephone Encounter (Signed)
Call placed to patient, confirmed LMP started  12/29/17.   Per review of OV notes dated 12/12/17, can call with menses to start depo-provera.   Call reviewed with covering provider, Dr. Edward JollySilva, ok to proceed with depo-provera injection 150mg  IM x1.    Routing to provider for final review. Patient is agreeable to disposition. Will close encounter.   Cc: Dr. Herbert DeanerJerstson

## 2018-01-01 NOTE — Progress Notes (Signed)
Patient is here for Depo Provera Injection Patient is within Depo Provera Calender Limits First Depo LMP 12.13.19  Next Depo Due between: 03/20/18-04/03/18 Last AEX:07/19/17 AEX Scheduled: 07/25/18  Patient is aware when next depo is due  Pt tolerated Injection well RUOQ   Routed to provider for review, encounter closed.

## 2018-01-01 NOTE — Telephone Encounter (Signed)
Patient called and scheduled her first depo provera injection for today at 10:30am after starting her cycle. She needs an order put in for this. Please see last visit notes.

## 2018-01-19 IMAGING — DX DG HIP (WITH OR WITHOUT PELVIS) 2V BILAT
5 series · 5 of 5 positions shown · non-contrast
Comparison: None

CLINICAL DATA: BILATERAL groin pain for 1 year, hip pain of
unspecified laterality

EXAM:
DG HIP (WITH OR WITHOUT PELVIS) 2V BILAT

[pelvis ap]
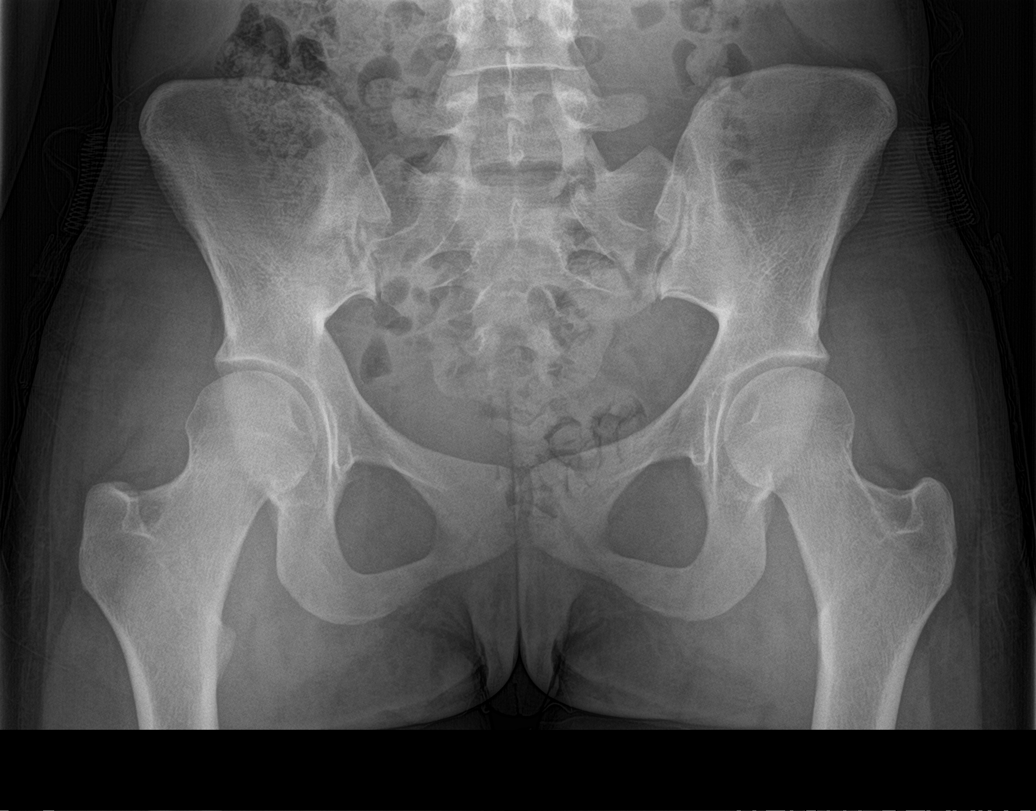

[hip ap (1 of 2)]
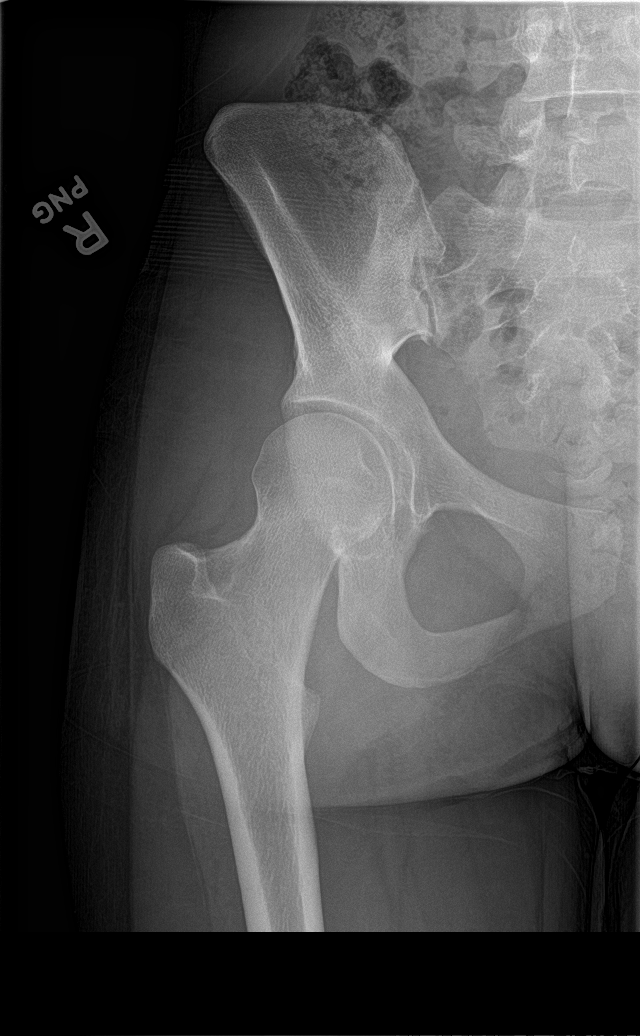

[hip lat (1 of 2)]
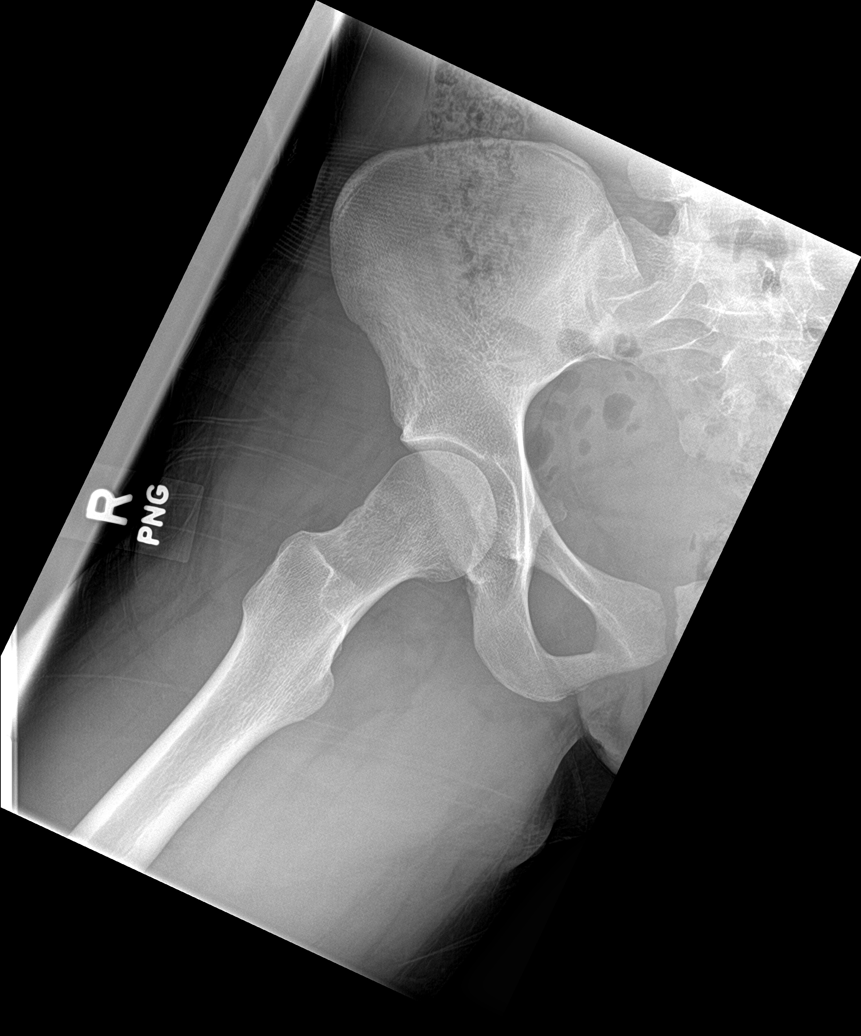

[hip ap (2 of 2)]
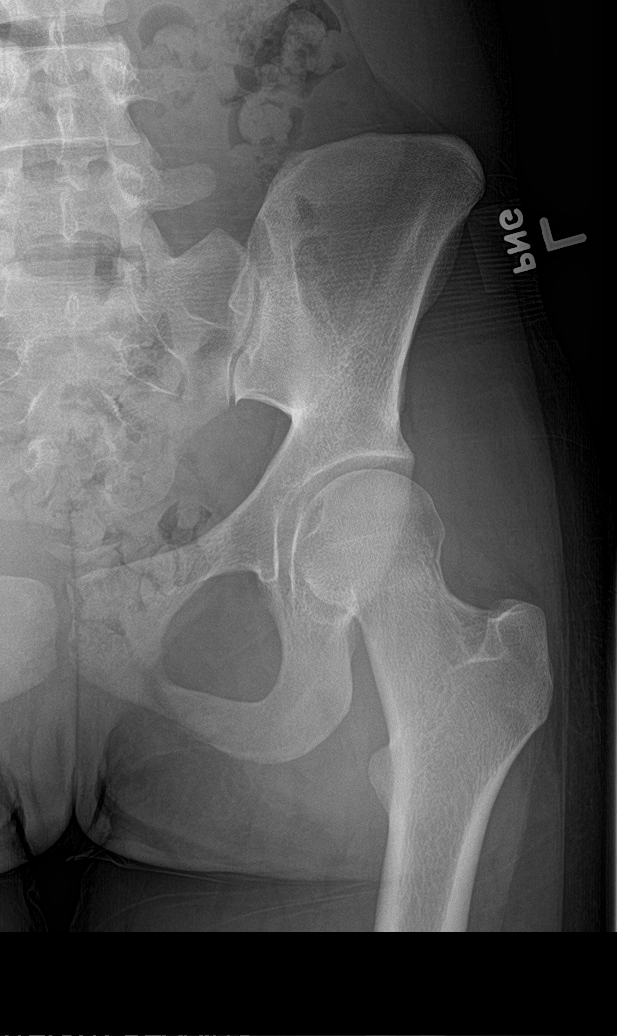

[hip lat (2 of 2)]
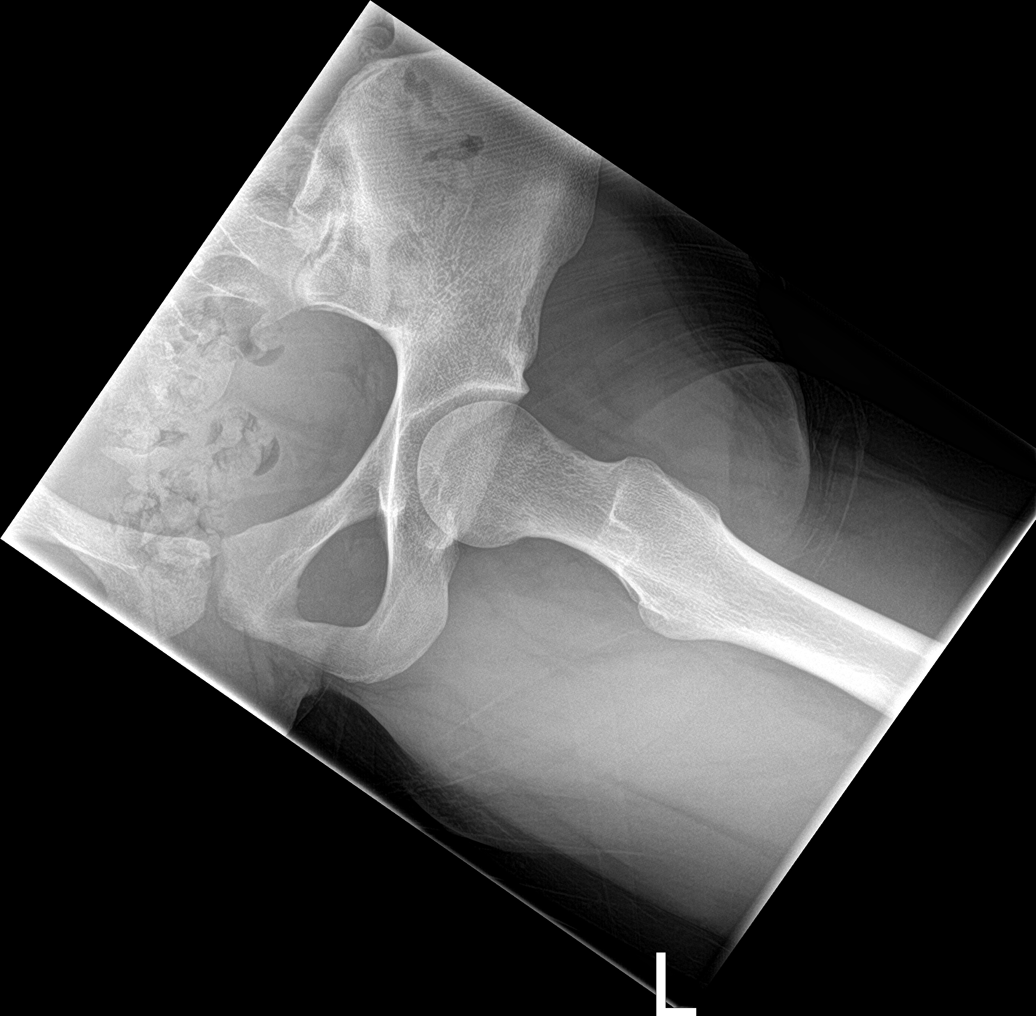

[5 of 5 positions shown; findings below may reference images not displayed]

FINDINGS: Pelvis intact.

Hip and SI joints symmetric and preserved.

Osseous mineralization normal.

No acute fracture, dislocation, or bone destruction.
IMPRESSION: Normal exam.

## 2018-01-25 ENCOUNTER — Encounter: Payer: Self-pay | Admitting: Nurse Practitioner

## 2018-01-25 ENCOUNTER — Ambulatory Visit: Payer: BLUE CROSS/BLUE SHIELD | Admitting: Nurse Practitioner

## 2018-01-25 VITALS — BP 110/62 | HR 83 | Temp 98.8°F | Ht 62.5 in | Wt 121.0 lb

## 2018-01-25 DIAGNOSIS — J209 Acute bronchitis, unspecified: Secondary | ICD-10-CM | POA: Diagnosis not present

## 2018-01-25 DIAGNOSIS — H6121 Impacted cerumen, right ear: Secondary | ICD-10-CM

## 2018-01-25 MED ORDER — AZITHROMYCIN 250 MG PO TABS: ORAL_TABLET | ORAL | 0 refills | Status: AC

## 2018-01-25 NOTE — Progress Notes (Signed)
Patient consent obtained. Irrigation with water and peroxide performed. Full view of tympanic membranes after procedure.  Patient tolerated procedure well.   

## 2018-01-25 NOTE — Progress Notes (Signed)
Mikayla Robinson is a 23 y.o. female with the following history as recorded in Epic[employer]:  Patient Active Problem List   Diagnosis Date Noted  . Rash 12/08/2017  . Hip pain 03/18/2015  . Dysmenorrhea 03/18/2015    Current Outpatient Medications  Medication Sig Dispense Refill  . meclizine (ANTIVERT) 12.5 MG tablet Take 1 tablet (12.5 mg total) by mouth 3 (three) times daily as needed for dizziness. 30 tablet 0  . medroxyPROGESTERone (DEPO-PROVERA) 150 MG/ML injection Inject 150 mg into the muscle every 3 (three) months.    . naproxen sodium (ANAPROX DS) 550 MG tablet 1 tablet po q 12 hours prn 30 tablet 2  . azithromycin (ZITHROMAX) 250 MG tablet Take 2 tablets today then 1 tablet daily until complete 6 tablet 0   No current facility-administered medications for this visit.     Allergies: Pollen extract and Yaz [drospirenone-ethinyl estradiol]  History reviewed. No pertinent past medical history.  History reviewed. No pertinent surgical history.  Family History  Problem Relation Age of Onset  . Healthy Mother   . Healthy Father   . Hypertension Maternal Grandmother   . Hyperlipidemia Maternal Grandmother   . Diabetes Maternal Grandfather   . Hypertension Paternal Grandmother   . Kidney disease Paternal Grandfather   . Diabetes Paternal Grandfather   . Asthma Sister     Social History   Tobacco Use  . Smoking status: Never Smoker  . Smokeless tobacco: Never Used  Substance Use Topics  . Alcohol use: No    Alcohol/week: 0.0 standard drinks     Subjective:  Mikayla Robinson is here today to establish care as a new patient to our practice, transferring from prior provider in the same practice. Works as Development worker, community in [place], enjoys hanging out with friends and watching TV in free time. Aside from primary care, she is routinely followed by GYN for routine womens care, contraception management. Patient's last menstrual period was 01/02/2018 (approximate). She is not  currently on any daily medications. She would like to discuss complaints of cough/cold and hearing loss today.  She c/o fevers, chills, headaches, rhinorrhea, sore throat, productive cough- yellow brown sputum for about 3 weeks now.  Denies weakness, syncope, sinus pain or pressure, cp, sob, loss of appetite, abdominal pain, n/v/d. Tried at home: robitussin helps some Not a smoker Symptoms are No better since onset  Hearing loss, right ear-  She has noticed decreased hearing from right ear over past several months Has has earwax impaction in past and thinks she might have earwax impaction again She denies injury, drainage, tinnitus, pain  ROS - See HPI   Objective:  Vitals:   01/25/18 1530  BP: 110/62  Pulse: 83  Temp: 98.8 F (37.1 C)  TempSrc: Oral  SpO2: 99%  Weight: 121 lb (54.9 kg)  Height: 5' 2.5" (1.588 m)    General: Well developed, well nourished, in no acute distress  Skin : Warm and dry.  Head: Normocephalic and atraumatic  Eyes: Sclera and conjunctiva clear; pupils round and reactive to light; extraocular movements intact  Ears: External normal; canals clear; bilateral tympanic membranes normal, right TM visualized after irrigation Oropharynx: Pink, supple. No suspicious lesions  Neck: Supple without thyromegaly, adenopathy  Lungs: Respirations unlabored; clear to auscultation bilaterally without wheeze, rales, rhonchi  CVS exam: normal rate and regular rhythm, S1 and S2 normal.  Extremities: No edema, cyanosis, clubbing  Vessels: Symmetric bilaterally  Neurologic: Alert and oriented; speech intact; face symmetrical; moves all extremities  well; CNII-XII intact without focal deficit  Psychiatric: Normal mood and affect.   Assessment:  1. Acute bronchitis, unspecified organism   2. Hearing loss due to cerumen impaction, right     Plan:  07/19/17 CBC, CMET, TSH by GYN WNL She will return annually for CPE or as needed for acute problems  1. Acute bronchitis,  unspecified organism Due to duration and no improvement in symptoms will start antibiotic course-medication dosing, side effects discussed Home management, red flags and return precautions including when to seek immediate care discussed and printed on AVS She will f/u for new, worsening symptoms or if symptoms continue after antibiotic   2. Hearing loss due to cerumen impaction, right irrigation/lavage of right ear with removal of impacted cerumen by CMA Patient tolerated well F/u for new or worsening symptoms  Return for CPE.  No orders of the defined types were placed in this encounter.   Requested Prescriptions   Signed Prescriptions Disp Refills  . azithromycin (ZITHROMAX) 250 MG tablet 6 tablet 0    Sig: Take 2 tablets today then 1 tablet daily until complete

## 2018-01-25 NOTE — Patient Instructions (Addendum)
Take z-pak as prescribed   Please follow up for fevers over 101, if your symptoms get worse, or if your symptoms dont improve with the antibiotic.  I will plan to see you back for annual physical, sooner if needed   Acute Bronchitis, Adult Acute bronchitis is when air tubes (bronchi) in the lungs suddenly get swollen. The condition can make it hard to breathe. It can also cause these symptoms:  A cough.  Coughing up clear, yellow, or green mucus.  Wheezing.  Chest congestion.  Shortness of breath.  A fever.  Body aches.  Chills.  A sore throat. Follow these instructions at home:  Medicines  Take over-the-counter and prescription medicines only as told by your doctor.  If you were prescribed an antibiotic medicine, take it as told by your doctor. Do not stop taking the antibiotic even if you start to feel better. General instructions  Rest.  Drink enough fluids to keep your pee (urine) pale yellow.  Avoid smoking and secondhand smoke. If you smoke and you need help quitting, ask your doctor. Quitting will help your lungs heal faster.  Use an inhaler, cool mist vaporizer, or humidifier as told by your doctor.  Keep all follow-up visits as told by your doctor. This is important. How is this prevented? To lower your risk of getting this condition again:  Wash your hands often with soap and water. If you cannot use soap and water, use hand sanitizer.  Avoid contact with people who have cold symptoms.  Try not to touch your hands to your mouth, nose, or eyes.  Make sure to get the flu shot every year. Contact a doctor if:  Your symptoms do not get better in 2 weeks. Get help right away if:  You cough up blood.  You have chest pain.  You have very bad shortness of breath.  You become dehydrated.  You faint (pass out) or keep feeling like you are going to pass out.  You keep throwing up (vomiting).  You have a very bad headache.  Your fever or chills  gets worse. This information is not intended to replace advice given to you by your health care provider. Make sure you discuss any questions you have with your health care provider. Document Released: 06/22/2007 Document Revised: 08/17/2016 Document Reviewed: 06/24/2015 Elsevier Interactive Patient Education  2019 [employer].

## 2018-02-09 ENCOUNTER — Other Ambulatory Visit: Payer: Self-pay | Admitting: Obstetrics and Gynecology

## 2018-02-16 ENCOUNTER — Telehealth: Payer: Self-pay | Admitting: Obstetrics and Gynecology

## 2018-02-16 NOTE — Telephone Encounter (Signed)
Spoke with patient. Patient reports mid lower back pain for 2-3 wks and "possible" intermittent urinary frequency, also reports drinking a lot of water. Denies fever/chills, N/V, diarrhea, dysuria, blood in urine, urgency, vaginal d/c, odor or abnormal vaginal bleeding. Depo provera for contraceptive. Reports she has been lifting more boxes at work, is unsure if related to injury. Has not tried any OTC motrin/ibuprofen.   Recommended patient f/u with PCP for further evaluation of lower back pain, if further evaluation is needed with GYN, return call to office to schedule OV. Dr. Oscar La will review, our office will return call if any additional recommendations. Patient verbalizes understanding.    Routing to provider for final review. Patient is agreeable to disposition. Will close encounter.

## 2018-02-16 NOTE — Telephone Encounter (Signed)
Patient is having lower back pain and would like to see Dr.Jertson.

## 2018-02-22 ENCOUNTER — Ambulatory Visit: Payer: BLUE CROSS/BLUE SHIELD | Admitting: Nurse Practitioner

## 2018-02-23 ENCOUNTER — Ambulatory Visit: Payer: BLUE CROSS/BLUE SHIELD | Admitting: Nurse Practitioner

## 2018-02-23 ENCOUNTER — Telehealth: Payer: Self-pay | Admitting: Obstetrics and Gynecology

## 2018-02-23 NOTE — Telephone Encounter (Signed)
Patient canceled her 3 month recheck appointment 02/26/18 due to out of network insurance.

## 2018-02-26 ENCOUNTER — Ambulatory Visit: Payer: BLUE CROSS/BLUE SHIELD | Admitting: Obstetrics and Gynecology

## 2018-03-15 ENCOUNTER — Telehealth: Payer: Self-pay | Admitting: Obstetrics and Gynecology

## 2018-03-15 NOTE — Telephone Encounter (Signed)
Patient called and cancelled her upcoming appointment for depo provera on 03/20/18 and also cancelled her annual exam 07/25/18. She said she has new insurance that requires she is seen in another health system specifically. Patient stated she will continue care there. Routing to provider for review and closing encounter.

## 2018-03-20 ENCOUNTER — Ambulatory Visit: Payer: BLUE CROSS/BLUE SHIELD

## 2018-07-25 ENCOUNTER — Ambulatory Visit: Payer: BLUE CROSS/BLUE SHIELD | Admitting: Obstetrics and Gynecology

## 2019-03-29 ENCOUNTER — Telehealth: Payer: Self-pay | Admitting: *Deleted

## 2019-03-29 NOTE — Telephone Encounter (Signed)
Left message to call Sanyla Summey, RN at [facility] [phone].   

## 2019-03-29 NOTE — Telephone Encounter (Signed)
Patient requests appointment today for nose bleed and sore throat. Advised this is GYN office.  Patient still requests appointment to be "checked out" but denied GYN problems.

## 2019-04-08 NOTE — Telephone Encounter (Signed)
No patient response from patient.  Routing to provider. Encounter closed.

## 2023-06-11 ENCOUNTER — Other Ambulatory Visit: Payer: Self-pay

## 2023-06-11 ENCOUNTER — Emergency Department (HOSPITAL_COMMUNITY)

## 2023-06-11 ENCOUNTER — Emergency Department (HOSPITAL_COMMUNITY)
Admission: EM | Admit: 2023-06-11 | Discharge: 2023-06-11 | Disposition: A | Discharge status: Home / Self Care | Attending: Student | Admitting: Student

## 2023-06-11 DIAGNOSIS — N939 Abnormal uterine and vaginal bleeding, unspecified: Secondary | ICD-10-CM | POA: Diagnosis present

## 2023-06-11 DIAGNOSIS — N938 Other specified abnormal uterine and vaginal bleeding: Secondary | ICD-10-CM

## 2023-06-11 LAB — URINALYSIS, W/ REFLEX TO CULTURE (INFECTION SUSPECTED)
Bilirubin Urine: NEGATIVE
Glucose, UA: NEGATIVE mg/dL
Ketones, ur: NEGATIVE mg/dL
Nitrite: NEGATIVE
Protein, ur: 30 mg/dL — AB
RBC / HPF: >50 RBC/hpf (ref 0–5)
Specific Gravity, Urine: 1.024 (ref 1.005–1.030)
pH: 6 (ref 5.0–8.0)

## 2023-06-11 LAB — CBC WITH DIFFERENTIAL/PLATELET
Abs Immature Granulocytes: 0.01 10*3/uL (ref 0.00–0.07)
Basophils Absolute: 0 10*3/uL (ref 0.0–0.1)
Basophils Relative: 0 %
Eosinophils Absolute: 0 10*3/uL (ref 0.0–0.5)
Eosinophils Relative: 1 %
HCT: 42.4 % (ref 36.0–46.0)
Hemoglobin: 13.9 g/dL (ref 12.0–15.0)
Immature Granulocytes: 0 %
Lymphocytes Relative: 29 %
Lymphs Abs: 1.7 10*3/uL (ref 0.7–4.0)
MCH: 30 pg (ref 26.0–34.0)
MCHC: 32.8 g/dL (ref 30.0–36.0)
MCV: 91.6 fL (ref 80.0–100.0)
Monocytes Absolute: 0.6 10*3/uL (ref 0.1–1.0)
Monocytes Relative: 9 %
Neutro Abs: 3.5 10*3/uL (ref 1.7–7.7)
Neutrophils Relative %: 61 %
Platelets: 244 10*3/uL (ref 150–400)
RBC: 4.63 MIL/uL (ref 3.87–5.11)
RDW: 12.6 % (ref 11.5–15.5)
WBC: 5.9 10*3/uL (ref 4.0–10.5)
nRBC: 0 % (ref 0.0–0.2)

## 2023-06-11 LAB — PREGNANCY, URINE: Preg Test, Ur: NEGATIVE

## 2023-06-11 NOTE — ED Notes (Signed)
 Patient requesting to be discharged; informed provider [place], PA.

## 2023-06-11 NOTE — Discharge Instructions (Signed)
 It was a pleasure taking care of you here today.  Your workup today was reassuring.  Make sure to follow-up with your OB/GYN.  Return for any new or worsening symptoms such as bleeding through pad in an hour, passing out, dizziness, severe abdominal pain

## 2023-06-11 NOTE — ED Triage Notes (Signed)
 Pt had her period at the beginning of may. Bleeding stopped. Pt began having heavy bleeding this morning along with cramping. Pt has used 1 pad today and would like to find out why she is bleeding again.

## 2023-06-11 NOTE — ED Notes (Signed)
 ED Provider at bedside.

## 2023-06-11 NOTE — ED Notes (Signed)
 Pt in ultrasound

## 2023-06-11 NOTE — ED Notes (Signed)
 Pt in bed, pt states that she is here for some vaginal bleeding since this am, states that it feels like a period, but she has already had her cycle

## 2023-06-11 NOTE — ED Provider Notes (Signed)
 [facility] Provider Note   CSN: [id number] Arrival date & time: 06/11/23  1342    History  Chief Complaint  Patient presents with   Vaginal Bleeding    Mikayla Robinson is a 28 y.o. female here for evaluation of vaginal bleeding.  Her second menstrual cycle this month.  She is some lower abdominal cramping.  No discharge.  Typically has regular cycles.  No fever, lightheadedness, no unilateral abdominal pain, flank pain, dysuria, hematuria.  No concern for pregnancy.  Depo for [facility].  No large clots.  She is concerned for ovarian cyst, hx of similar   HPI     Home Medications Prior to Admission medications   Medication Sig Start Date End Date Taking? Authorizing Provider  azithromycin  (ZITHROMAX ) 250 MG tablet Take 2 tablets today then 1 tablet daily until complete 01/25/18   Dragnev, Licia Reek, NP  meclizine  (ANTIVERT ) 12.5 MG tablet Take 1 tablet (12.5 mg total) by mouth 3 (three) times daily as needed for dizziness. 10/19/17   Jertson, Jill Evelyn, MD  medroxyPROGESTERone  (DEPO-PROVERA ) 150 MG/ML injection Inject 150 mg into the muscle every 3 (three) months.    [provider]  naproxen  sodium (ANAPROX  DS) 550 MG tablet 1 tablet po q 12 hours prn 04/18/17   Jertson, Jill Evelyn, MD      Allergies    Pollen extract and Yaz [drospirenone -ethinyl estradiol ]    Review of Systems   Review of Systems  Constitutional: Negative.   HENT: Negative.    Respiratory: Negative.    Cardiovascular: Negative.   Gastrointestinal: Negative.   Genitourinary:  Positive for menstrual problem and vaginal bleeding. Negative for decreased urine volume, difficulty urinating, dysuria, flank pain, frequency, hematuria, pelvic pain, urgency, vaginal discharge and vaginal pain.  Neurological: Negative.   All other systems reviewed and are negative.   Physical Exam Updated Vital Signs BP 120/74   Pulse 86   Temp 98 F (36.7 C) (Oral)    Resp 17   Ht 5\' 3"  (1.6 m)   Wt 58.1 kg   LMP 05/22/2023   SpO2 97%   BMI 22.67 kg/m  Physical Exam Vitals and nursing note reviewed.  Constitutional:      General: She is not in acute distress.    Appearance: She is well-developed. She is not ill-appearing, toxic-appearing or diaphoretic.  HENT:     Head: Atraumatic.     Mouth/Throat:     Mouth: Mucous membranes are moist.  Eyes:     Pupils: Pupils are equal, round, and reactive to light.  Cardiovascular:     Rate and Rhythm: Normal rate.     Pulses: Normal pulses.     Heart sounds: Normal heart sounds.  Pulmonary:     Effort: Pulmonary effort is normal. No respiratory distress.     Breath sounds: Normal breath sounds.  Abdominal:     General: Bowel sounds are normal. There is no distension.     Palpations: Abdomen is soft.     Tenderness: There is no abdominal tenderness. There is no right CVA tenderness, left CVA tenderness or guarding.  Musculoskeletal:        General: Normal range of motion.     Cervical back: Normal range of motion.     Right lower leg: No edema.     Left lower leg: No edema.  Skin:    General: Skin is warm and dry.     Capillary Refill: Capillary refill takes less  than 2 seconds.  Neurological:     General: No focal deficit present.     Mental Status: She is alert and oriented to person, place, and time.     Cranial Nerves: No cranial nerve deficit.     Motor: No weakness.     Gait: Gait normal.  Psychiatric:        Mood and Affect: Mood normal.     ED Results / Procedures / Treatments   Labs (all labs ordered are listed, but only abnormal results are displayed) Labs Reviewed  URINALYSIS, W/ REFLEX TO CULTURE (INFECTION SUSPECTED) - Abnormal; Notable for the following components:      Result Value   APPearance HAZY (*)    Hgb urine dipstick LARGE (*)    Protein, ur 30 (*)    Leukocytes,Ua TRACE (*)    Bacteria, UA RARE (*)    All other components within normal limits  PREGNANCY,  URINE  CBC WITH DIFFERENTIAL/PLATELET    EKG None  Radiology US  PELVIC COMPLETE W TRANSVAGINAL AND TORSION R/O Result Date: 06/11/2023 CLINICAL DATA:  Pelvic pain EXAM: TRANSABDOMINAL AND TRANSVAGINAL ULTRASOUND OF PELVIS DOPPLER ULTRASOUND OF OVARIES TECHNIQUE: Both transabdominal and transvaginal ultrasound examinations of the pelvis were performed. Transabdominal technique was performed for global imaging of the pelvis including uterus, ovaries, adnexal regions, and pelvic cul-de-sac. It was necessary to proceed with endovaginal exam following the transabdominal exam to visualize the endometrium and adnexa. Color and duplex Doppler ultrasound was utilized to evaluate blood flow to the ovaries. COMPARISON:  Pelvic ultrasound 02/17/2023 FINDINGS: Transabdominal images are significant limited by the overlapping bowel gas and soft tissue as well as underdistended urinary bladder, poor sonographic window Uterus and endometrium Uterus: 6.2 x 3.2 x 4.1 cm. Volume 42.1 cc. Endometrial stripe of 2 mm. Preserved myometrium. No endometrial lesion seen at this time. Right ovary Measurements: 2.7 x 1.3 x 1.7 cm = volume: 3 mL. Normal appearance/no adnexal mass. Multiple follicles are identified. Left ovary Measurements: 3.1 x 1.8 x 2.6 cm = volume: 7.7 mL. Normal appearance/no adnexal mass. Pulsed Doppler evaluation of both ovaries demonstrates normal low-resistance arterial and venous waveforms. Other findings No significant free fluid IMPRESSION: Unremarkable pelvic ultrasound. Transabdominal images are limited. No free fluid. Electronically Signed   By: Adrianna Horde M.D.   On: 06/11/2023 19:22    Procedures Procedures    Medications Ordered in ED Medications - No data to display  ED Course/ Medical Decision Making/ A&P    Please have-year-old here for evaluation of vaginal bleeding.  She is having a second menstrual cycle this month.  Has some lower abdominal cramping.  No focal pain.  She has a  history of ovarian cyst.  No discharge or concern for any STI.  No dysuria or hematuria.  No abdominal pain, flank pain.  No fever.  We discussed holding off on imaging versus imaging.  Patient would like ultrasound.  Labs and imaging personally viewed interpreted Pregnancy test negative CBC without leukocytosis hemoglobin stable at 13.9 UA with blood on cycle, no evidence of infection Ultrasound evidence of torsion  Patient reassessed.  Has not needed anything for her symptoms.  Is only gone through 1 pad today.  Will have her keep close follow-up with her OB/GYN, return for new or worsening symptoms.  On repeat exam patient does not have a surgical abdomin and there are no peritoneal signs.  No indication of appendicitis, bowel obstruction, bowel perforation, cholecystitis, diverticulitis, PID, intermittent/persistent torsion, TOA or ectopic pregnancy.  Patient discharged home with symptomatic treatment and given strict instructions for follow-up with their primary care physician.  I have also discussed reasons to return immediately to the ER.  Patient expresses understanding and agrees with plan.                                  Medical Decision Making Amount and/or Complexity of Data Reviewed Independent Historian: parent External Data Reviewed: labs, radiology and notes. Labs: ordered. Decision-making details documented in ED Course. Radiology: ordered and independent interpretation performed. Decision-making details documented in ED Course.  Risk OTC drugs. Decision regarding hospitalization. Diagnosis or treatment significantly limited by social determinants of health.          Final Clinical Impression(s) / ED Diagnoses Final diagnoses:  Dysfunctional uterine bleeding    Rx / DC Orders ED Discharge Orders     None         Lawren Sexson A, PA-C 06/11/23 1934    Kommor, Alyse July, MD 06/13/23 [phone]
